# Patient Record
Sex: Female | Born: 1940
Health system: Southern US, Community
[De-identification: ages and names within clinical notes are randomized; demographics above are authoritative.]

## PROBLEM LIST (undated history)

## (undated) DIAGNOSIS — K219 Gastro-esophageal reflux disease without esophagitis: Secondary | ICD-10-CM

## (undated) DIAGNOSIS — M1990 Unspecified osteoarthritis, unspecified site: Secondary | ICD-10-CM

## (undated) DIAGNOSIS — IMO0001 Reserved for inherently not codable concepts without codable children: Secondary | ICD-10-CM

## (undated) DIAGNOSIS — J449 Chronic obstructive pulmonary disease, unspecified: Secondary | ICD-10-CM

## (undated) HISTORY — PX: ABDOMINAL HYSTERECTOMY: SHX81

## (undated) HISTORY — PX: TOTAL KNEE ARTHROPLASTY: SHX125

---

## 1998-11-28 ENCOUNTER — Other Ambulatory Visit: Admission: RE | Admit: 1998-11-28 | Discharge: 1998-11-28 | Payer: Self-pay | Admitting: Gynecology

## 1998-12-04 ENCOUNTER — Ambulatory Visit (HOSPITAL_BASED_OUTPATIENT_CLINIC_OR_DEPARTMENT_OTHER): Admission: RE | Admit: 1998-12-04 | Discharge: 1998-12-04 | Payer: Self-pay | Admitting: Surgery

## 1999-07-19 ENCOUNTER — Ambulatory Visit (HOSPITAL_COMMUNITY): Admission: RE | Admit: 1999-07-19 | Discharge: 1999-07-19 | Payer: Self-pay | Admitting: *Deleted

## 1999-08-20 ENCOUNTER — Ambulatory Visit (HOSPITAL_BASED_OUTPATIENT_CLINIC_OR_DEPARTMENT_OTHER): Admission: RE | Admit: 1999-08-20 | Discharge: 1999-08-20 | Payer: Self-pay | Admitting: Orthopedic Surgery

## 1999-12-04 ENCOUNTER — Other Ambulatory Visit: Admission: RE | Admit: 1999-12-04 | Discharge: 1999-12-04 | Payer: Self-pay | Admitting: Obstetrics and Gynecology

## 2000-02-07 ENCOUNTER — Ambulatory Visit (HOSPITAL_BASED_OUTPATIENT_CLINIC_OR_DEPARTMENT_OTHER): Admission: RE | Admit: 2000-02-07 | Discharge: 2000-02-07 | Payer: Self-pay | Admitting: Orthopedic Surgery

## 2001-01-22 ENCOUNTER — Ambulatory Visit (HOSPITAL_COMMUNITY): Admission: RE | Admit: 2001-01-22 | Discharge: 2001-01-22 | Payer: Self-pay | Admitting: *Deleted

## 2001-04-10 ENCOUNTER — Encounter: Payer: Self-pay | Admitting: Orthopedic Surgery

## 2001-04-20 ENCOUNTER — Inpatient Hospital Stay (HOSPITAL_COMMUNITY): Admission: RE | Admit: 2001-04-20 | Discharge: 2001-04-24 | Payer: Self-pay | Admitting: Orthopedic Surgery

## 2002-01-19 ENCOUNTER — Other Ambulatory Visit: Admission: RE | Admit: 2002-01-19 | Discharge: 2002-01-19 | Payer: Self-pay | Admitting: Obstetrics and Gynecology

## 2004-05-15 ENCOUNTER — Other Ambulatory Visit: Admission: RE | Admit: 2004-05-15 | Discharge: 2004-05-15 | Payer: Self-pay | Admitting: Obstetrics and Gynecology

## 2004-12-18 ENCOUNTER — Ambulatory Visit: Payer: Self-pay | Admitting: Pulmonary Disease

## 2005-01-23 ENCOUNTER — Ambulatory Visit: Payer: Self-pay | Admitting: Pulmonary Disease

## 2010-01-19 ENCOUNTER — Telehealth (INDEPENDENT_AMBULATORY_CARE_PROVIDER_SITE_OTHER): Payer: Self-pay | Admitting: *Deleted

## 2010-10-18 NOTE — Progress Notes (Signed)
Summary: [facility] records request  Records request received from the fax machine. Forwarded to [employer] for processing. Rene Kocher Flowers  Jan 19, 2010 5:01 PM

## 2011-06-05 ENCOUNTER — Other Ambulatory Visit: Payer: Self-pay | Admitting: Orthopedic Surgery

## 2011-06-05 ENCOUNTER — Encounter (HOSPITAL_COMMUNITY): Payer: Medicare Other

## 2011-06-05 LAB — COMPREHENSIVE METABOLIC PANEL
ALT: 21 U/L (ref 0–35)
AST: 24 U/L (ref 0–37)
Albumin: 3.8 g/dL (ref 3.5–5.2)
Alkaline Phosphatase: 75 U/L (ref 39–117)
BUN: 20 mg/dL (ref 6–23)
CO2: 29 mEq/L (ref 19–32)
Calcium: 9.3 mg/dL (ref 8.4–10.5)
Chloride: 102 mEq/L (ref 96–112)
Creatinine, Ser: 0.73 mg/dL (ref 0.50–1.10)
GFR calc Af Amer: >60 mL/min (ref >60)
GFR calc non Af Amer: >60 mL/min (ref >60)
Glucose, Bld: 86 mg/dL (ref 70–99)
Potassium: 4 mEq/L (ref 3.5–5.1)
Sodium: 139 mEq/L (ref 135–145)
Total Bilirubin: 0.3 mg/dL (ref 0.3–1.2)
Total Protein: 7.7 g/dL (ref 6.0–8.3)

## 2011-06-05 LAB — SURGICAL PCR SCREEN
MRSA, PCR: NEGATIVE
Staphylococcus aureus: NEGATIVE

## 2011-06-05 LAB — CBC
HCT: 42.3 % (ref 36.0–46.0)
Hemoglobin: 14.2 g/dL (ref 12.0–15.0)
MCH: 31.6 pg (ref 26.0–34.0)
MCHC: 33.6 g/dL (ref 30.0–36.0)
MCV: 94.2 fL (ref 78.0–100.0)
Platelets: 186 10*3/uL (ref 150–400)
RBC: 4.49 MIL/uL (ref 3.87–5.11)
RDW: 13.4 % (ref 11.5–15.5)
WBC: 5.2 10*3/uL (ref 4.0–10.5)

## 2011-06-05 LAB — URINALYSIS, ROUTINE W REFLEX MICROSCOPIC
Bilirubin Urine: NEGATIVE
Glucose, UA: NEGATIVE mg/dL
Hgb urine dipstick: NEGATIVE
Ketones, ur: NEGATIVE mg/dL
Leukocytes, UA: NEGATIVE
Nitrite: NEGATIVE
Protein, ur: NEGATIVE mg/dL
Specific Gravity, Urine: 1.023 (ref 1.005–1.030)
Urobilinogen, UA: 1 mg/dL (ref 0.0–1.0)
pH: 5.5 (ref 5.0–8.0)

## 2011-06-05 LAB — PROTIME-INR
INR: 1.05 (ref 0.00–1.49)
Prothrombin Time: 13.9 seconds (ref 11.6–15.2)

## 2011-06-05 LAB — APTT: aPTT: 25 seconds (ref 24–37)

## 2011-06-05 LAB — ABO/RH: ABO/RH(D): A NEG

## 2011-06-10 ENCOUNTER — Inpatient Hospital Stay (HOSPITAL_COMMUNITY)
Admission: RE | Admit: 2011-06-10 | Discharge: 2011-06-13 | DRG: 470 | Disposition: A | Discharge status: Home Health | Payer: Medicare Other | Source: Ambulatory Visit | Attending: Orthopedic Surgery | Admitting: Orthopedic Surgery

## 2011-06-10 DIAGNOSIS — H547 Unspecified visual loss: Secondary | ICD-10-CM | POA: Diagnosis present

## 2011-06-10 DIAGNOSIS — J449 Chronic obstructive pulmonary disease, unspecified: Secondary | ICD-10-CM | POA: Diagnosis present

## 2011-06-10 DIAGNOSIS — Z01812 Encounter for preprocedural laboratory examination: Secondary | ICD-10-CM

## 2011-06-10 DIAGNOSIS — J4489 Other specified chronic obstructive pulmonary disease: Secondary | ICD-10-CM | POA: Diagnosis present

## 2011-06-10 DIAGNOSIS — M1710 Unilateral primary osteoarthritis, unspecified knee: Principal | ICD-10-CM | POA: Diagnosis present

## 2011-06-10 DIAGNOSIS — I8390 Asymptomatic varicose veins of unspecified lower extremity: Secondary | ICD-10-CM | POA: Diagnosis present

## 2011-06-10 DIAGNOSIS — Z96659 Presence of unspecified artificial knee joint: Secondary | ICD-10-CM

## 2011-06-10 DIAGNOSIS — M21169 Varus deformity, not elsewhere classified, unspecified knee: Secondary | ICD-10-CM | POA: Diagnosis present

## 2011-06-10 DIAGNOSIS — Z79899 Other long term (current) drug therapy: Secondary | ICD-10-CM

## 2011-06-10 DIAGNOSIS — M899 Disorder of bone, unspecified: Secondary | ICD-10-CM | POA: Diagnosis present

## 2011-06-10 LAB — TYPE AND SCREEN
ABO/RH(D): A NEG
Antibody Screen: NEGATIVE

## 2011-06-11 LAB — BASIC METABOLIC PANEL
CO2: 27 mEq/L (ref 19–32)
Calcium: 8.6 mg/dL (ref 8.4–10.5)
Chloride: 101 mEq/L (ref 96–112)
Sodium: 135 mEq/L (ref 135–145)

## 2011-06-11 LAB — CBC
Platelets: 168 10*3/uL (ref 150–400)
RBC: 3.57 MIL/uL — ABNORMAL LOW (ref 3.87–5.11)
WBC: 7.1 10*3/uL (ref 4.0–10.5)

## 2011-06-12 LAB — CBC
HCT: 30.6 % — ABNORMAL LOW (ref 36.0–46.0)
MCV: 91.9 fL (ref 78.0–100.0)
Platelets: 167 10*3/uL (ref 150–400)
RBC: 3.33 MIL/uL — ABNORMAL LOW (ref 3.87–5.11)
WBC: 9.6 10*3/uL (ref 4.0–10.5)

## 2011-06-12 LAB — BASIC METABOLIC PANEL
CO2: 28 mEq/L (ref 19–32)
Chloride: 95 mEq/L — ABNORMAL LOW (ref 96–112)
Creatinine, Ser: 0.63 mg/dL (ref 0.50–1.10)

## 2011-06-12 NOTE — Op Note (Signed)
[name], [name] NO.:  [id number]  MEDICAL RECORD NO.:  [id number]  LOCATION:  1608                         FACILITY:  [facility]  PHYSICIAN:  Ollen Gross, M.D.    DATE OF BIRTH:  July 23, 1941  DATE OF PROCEDURE:  06/10/2011 DATE OF DISCHARGE:                              OPERATIVE REPORT   PREOPERATIVE DIAGNOSIS:  Osteoarthritis, right knee.  POSTOPERATIVE DIAGNOSIS:  Osteoarthritis, right knee.  PROCEDURE:  Right total knee arthroplasty.  SURGEON:  Ollen Gross, M.D.  ASSISTANT:  Alexzandrew L. Perkins, P.A.C.  ANESTHESIA:  Spinal.  ESTIMATED BLOOD LOSS:  Minimal.  DRAIN:  Hemovac x1.  TOURNIQUET TIME:  39 minutes at 300 mmHg.  COMPLICATIONS:  None.  CONDITION:  Stable to recovery.  BRIEF CLINICAL NOTE:  [name] is a 70 year old female with advanced end-stage arthritis of the right knee with progressively worsening pain and dysfunction.  She has had bone-on-bone arthritis in the medial compartment of the knee.  It is affecting her ability to do activities of daily living.  She has had injections without benefit.  She has had a successful left total knee arthroplasty who presents now for a right total knee arthroplasty.  PROCEDURE IN DETAIL:  After successful administration of spinal anesthetic, a tourniquet was placed high on her right thigh and a right lower extremity was prepped and draped in the usual sterile fashion. Extremities wrapped in Esmarch, knee flexed, tourniquet inflated 300 mmHg.  Midline incision was made with a 10 blade through subcutaneous tissue to the level of the extensor mechanism.  A fresh blade was used to make a medial parapatellar arthrotomy.  Soft tissue of the proximal medial tibia subperiosteally elevated to the joint line with a knife and into the semimembranosus bursa with a Cobb elevator.  Soft tissue laterally was elevated with attention being paid to avoid any patellar tendon on tibial tubercle.  The  patella was everted, knee flexed 90 degrees, an ACL and PCL removed.  She has got severe bone-on-bone change throughout the medial compartment with significant chondromalacia and exposed bone lateral as well as exposed bone patellofemoral.  The drill was used to create a starting hole in the distal femur and the canal was thoroughly irrigated.  The 5 degree right valgus alignment guide was placed and the distal femoral cutting block was pinned to remove 10 mm off the distal femur.  Resection was made with an oscillating saw.  The tibia subluxed forward and the menisci were removed.  Extramedullary tibial alignment guide was placed referencing proximally at the medial aspect of the tibial tubercle distally along the second metatarsal axis and tibial crest.  Block was pinned to remove 2 mm off the more deficient medial side.  Tibial resection was made with an oscillating saw.  Size 3 was the most appropriate tibial component and the proximal tibia was prepared with modular drill and keel punch for the size 3.  Femoral sizing guide was placed, size 3 was most appropriate on the femur.  Rotations marked of the epicondylar axis and confirmed by creating rectangular flexion gap at 90 degrees.  The block was pinned in that rotation and the anterior, posterior, and  chamfer cuts were made. Intercondylar block was placed and that cut was made.  Trial size 3 posterior stabilized femur was placed.  A 12.5 mm posterior stabilized rotating platform insert trial was placed.  There was a tiny bit of play, with that it went to 15, which allowed for full extension with excellent varus-valgus and anterior-posterior balance throughout full range of motion.  The patella was everted and the thickness measured to be 22 mm.  Freehand resection was taken to 12 mm, 8 template was placed, lug holes were drilled, trial patella was placed, and it tracks normally.  Osteophytes removed off the posterior femur with the  trial in place.  All trials were removed and the cut bone surfaces were prepared with pulsatile lavage.  Cement was mixed and once ready for implantation, a size 3 mobile bearing tibial tray, size 3 posterior stabilized femur and 38 patella are cemented into place and the patella was held with a clamp.  Trial 15 mm inserts placed, knee held in full extension, all extruded cement removed.  When cement fully hardened, then the permanent 15 mm posterior stabilized rotating platform insert was placed in the tibial tray.  The wound was copiously irrigated with saline solution and the arthrotomy closed over Hemovac drain with interrupted #1 PDS.  Flexion against gravity was 135 degrees and patella tracks normally.  Subcu was closed with interrupted 2-0 Vicryl and subcuticular running 4-0 Monocryl.  The catheter for a Marcaine pain pump was placed and the pump was initiated.  Incisions were cleaned and dried and Steri-Strips and bulky sterile dressing applied.  She was then placed into a knee immobilizer, awakened, transferred to recovery in stable condition.  Please note that it was a medical necessity for a surgical assistant on this case in order to perform it in a safe and expeditious manner. Surgical assistant help with retraction of the ligaments and lateral neurovascular structures and also positioning the leg such that the prosthetic components can be placed in anatomic position.     Ollen Gross, M.D.     FA/MEDQ  D:  06/10/2011  T:  06/11/2011  Job:  [id number]  Electronically Signed by Ollen Gross M.D. on 06/12/2011 10:34:43 AM

## 2011-06-13 LAB — BASIC METABOLIC PANEL
BUN: 8 mg/dL (ref 6–23)
Calcium: 8.8 mg/dL (ref 8.4–10.5)
Chloride: 94 mEq/L — ABNORMAL LOW (ref 96–112)
Creatinine, Ser: 0.56 mg/dL (ref 0.50–1.10)
GFR calc Af Amer: >60 mL/min (ref >60)
GFR calc non Af Amer: >60 mL/min (ref >60)

## 2011-06-13 LAB — CBC
MCHC: 34.9 g/dL (ref 30.0–36.0)
MCV: 91.2 fL (ref 78.0–100.0)
Platelets: 185 10*3/uL (ref 150–400)
RDW: 13 % (ref 11.5–15.5)
WBC: 10.8 10*3/uL — ABNORMAL HIGH (ref 4.0–10.5)

## 2011-06-26 NOTE — H&P (Signed)
[name], [name] NO.:  [id number]  MEDICAL RECORD NO.:  [id number]  LOCATION:  1608                         FACILITY:  [facility]  PHYSICIAN:  Ollen Gross, M.D.    DATE OF BIRTH:  [birth year]  DATE OF ADMISSION:  06/10/2011 DATE OF DISCHARGE:                             HISTORY & PHYSICAL   CHIEF COMPLAINT:  Right knee pain.  HISTORY OF PRESENT ILLNESS:  The patient is a 70 year old female, well known to Dr. Ollen Gross, having previously been seen for her right knee.  She has known arthritis, which has progressively gotten worse with time.  X-rays in the office, unfortunately, have shown that she has progressed to the point where she is bone-on-bone in the medial compartment and also very close to being bone-on-bone in the patellofemoral compartment.  She has already started to go into a little bit of varus deformity.  She has advanced arthritis.  She has been treated in the past with injections and unfortunately continues to have pain despite conservative measures including injections.  It is felt she would benefit from undergoing surgical intervention.  Risks and benefits have been discussed.  She elected to proceed with surgery.  She has been seen preop by Dr. Earl Gala and felt to be stable for upcoming procedure.  ALLERGIES:  No known drug allergies.  INTOLERANCES:  OXYCODONE causes heartburn and "itchy feeling."  CURRENT MEDICATIONS:  Evista, vitamin D, calcium, multivitamin, Relafen, and Chlorphen.  PAST MEDICAL HISTORY: 1. Impaired vision. 2. Slight COPD. 3. Slight asthma. 4. Varicose veins. 5. Osteopenia.  PAST SURGICAL HISTORY:  Left total knee replacement in 2002.  FAMILY HISTORY:  Mother with Alzheimer's.  Sister with breast and uterine cancer.  SOCIAL HISTORY:  Married.  [employer].  Past smoker, quit in 1985.  1-2 glasses of wine daily.  She has 3 children.  She has 3 steps entering her home.  She does have a  living will healthcare power of attorney.  REVIEW OF SYSTEMS:  GENERAL:  No fever, chills, or night sweats.  NEURO: No seizure, syncope, or paralysis.  RESPIRATORY:  No shortness of breath, productive cough, or hemoptysis.  CARDIOVASCULAR:  No chest pain, angina, orthopnea.  GI:  No nausea, vomiting, diarrhea, or constipation.  GU:  No dysuria, hematuria, or discharge. MUSCULOSKELETAL:  Knee pain.  PHYSICAL EXAMINATION:  VITAL SIGNS:  Pulse 72, respirations 14, blood pressure 116/62. GENERAL:  A 70 year old white female, well nourished, well developed, petite frame.  She is alert, oriented, and cooperative.  Excellent historian.  She is accompanied by her friend. HEENT:  Normocephalic, atraumatic.  Pupils are round and reactive.  EOMs intact. NECK:  Supple. CHEST:  Clear. HEART:  Regular rate and rhythm without murmur.  S1, S2 noted. ABDOMEN:  Soft, nontender.  Bowel sounds present. RECTAL/BREASTS/GENITALIA:  Not done, not pertinent to present illness. EXTREMITIES:  Right knee, moderate crepitus, tender more medial joint line, range of motion 5-125 with slight varus deformity.  IMPRESSION:  Osteoarthritis, right knee.  PLAN:  The patient admitted to [facility] to undergo a right total knee replacement arthroplasty.  Surgery will be performed by Dr. Ollen Gross.  Her plan is to go home following the hospital stay.     Alexzandrew L. Julien Girt, P.A.C.   ______________________________ Ollen Gross, M.D.    ALP/MEDQ  D:  06/12/2011  T:  06/13/2011  Job:  [id number]  cc:   [name], M.D. Fax: [phone]  Electronically Signed by Patrica Duel P.A.C. on 06/13/2011 11:20:26 AM Electronically Signed by Ollen Gross M.D. on 06/26/2011 11:18:24 AM

## 2011-06-28 NOTE — Discharge Summary (Signed)
NAMESHAKILA, MAK NO.:  [id number]  MEDICAL RECORD NO.:  [id number]  LOCATION:  1608                         FACILITY:  [facility]  PHYSICIAN:  Ollen Gross, M.D.    DATE OF BIRTH:  03-30-1941  DATE OF ADMISSION:  06/10/2011 DATE OF DISCHARGE:  06/13/2011                              DISCHARGE SUMMARY   ADMITTING DIAGNOSES: 1. Osteoarthritis of the right knee. 2. Impaired vision. 3. Slight chronic obstructive pulmonary disease. 4. Slight asthma. 5. Varicose veins. 6. Osteopenia.  DISCHARGE DIAGNOSES: 1. Osteoarthritis of the right knee, status post right total knee     replacement arthroplasty. 2. Postoperative hyponatremia, improving. 3. Impaired vision. 4. Slight chronic obstructive pulmonary disease. 5. Slight asthma. 6. Varicose veins 7. Osteopenia.  PROCEDURE:  June 10, 2011, right total knee.  SURGEON:  Ollen Gross, MD  ASSISTANT:  Alexzandrew L. Perkins, P.A.C. ANESTHESIA:  Spinal anesthesia.  TOURNIQUET TIME:  39 minutes.  CONSULTS:  None.  BRIEF HISTORY:  Ms. [name] is a 70 year old female with advanced end- stage arthritis of the right knee, progressive worsening pain and dysfunction.  She has bone-on-bone arthritis in the medial compartment affecting her ability to do activities of daily living.  She has had injections without benefit, now presents for total knee arthroplasty.  LABORATORY DATA:  The CBC is not scanned in the chart, not available, but the postop hemoglobin was 11.3 drifted down to 10.8, last known hemoglobin and hematocrit of 10.1 and 28.9.  Chem panel not scanned in the chart.  Sodium was 135 dropped down to 128, back up to 129; chloride dropped from 101 to 94 with the sodium drop; glucose went up from 114 to 142, back down to 129.  HOSPITAL COURSE:  The patient admitted to the [facility], taken to the OR, and underwent above-stated procedure without complication.  The patient tolerated the  procedure well, later transferred to recovery room on orthopedic floor, started on p.o. and IV analgesic pain control following surgery, given 24 hours postop IV antibiotics, had decent output through Foley.  Hemovac drain placed during the surgery was pulled.  She was started on PCA initially and that was discontinued on day #1.  By day #2, pain was under little bit better control.  Her sodium was down though, so we rechecked that after stopping the fluids.  Dressing changed, incision looked good.  Continued to get up with therapy, walking short distances and progressing with her mobility.  Up to the point day #3, she was doing well.  She was meeting her goals, tolerating her meds.  Her sodium had stabilized and starting to come back up and she was discharged home at that time.  DISCHARGE PLAN: 1. The patient was discharged home on June 13, 2011. 2. Discharge diagnoses, please see above. 3. Discharge meds; OxyIR, Robaxin, Xarelto.  Continue chlorpheniramine     over the counter.  DIET:  Heart-healthy diet as tolerated.  ACTIVITY:  Weightbearing as tolerated.  Total knee protocol.  FOLLOWUP:  2 weeks.  DISPOSITION:  Home.  CONDITION ON DISCHARGE:  Improved.     Alexzandrew L. Julien Girt, P.A.C.   ______________________________ Ollen Gross, M.D.  ALP/MEDQ  D:  06/21/2011  T:  06/21/2011  Job:  [id number]  cc:   [name], M.D. Fax: [phone]  Electronically Signed by Patrica Duel P.A.C. on 06/28/2011 06:30:30 AM Electronically Signed by Ollen Gross M.D. on 06/28/2011 95:62:13 PM

## 2011-09-23 DIAGNOSIS — Z1231 Encounter for screening mammogram for malignant neoplasm of breast: Secondary | ICD-10-CM | POA: Diagnosis not present

## 2011-09-23 DIAGNOSIS — Z803 Family history of malignant neoplasm of breast: Secondary | ICD-10-CM | POA: Diagnosis not present

## 2011-10-03 DIAGNOSIS — H524 Presbyopia: Secondary | ICD-10-CM | POA: Diagnosis not present

## 2011-10-03 DIAGNOSIS — H11159 Pinguecula, unspecified eye: Secondary | ICD-10-CM | POA: Diagnosis not present

## 2011-10-18 DIAGNOSIS — L820 Inflamed seborrheic keratosis: Secondary | ICD-10-CM | POA: Diagnosis not present

## 2011-11-18 DIAGNOSIS — L570 Actinic keratosis: Secondary | ICD-10-CM | POA: Diagnosis not present

## 2012-01-22 DIAGNOSIS — Z1331 Encounter for screening for depression: Secondary | ICD-10-CM | POA: Diagnosis not present

## 2012-01-22 DIAGNOSIS — J449 Chronic obstructive pulmonary disease, unspecified: Secondary | ICD-10-CM | POA: Diagnosis not present

## 2012-01-22 DIAGNOSIS — M949 Disorder of cartilage, unspecified: Secondary | ICD-10-CM | POA: Diagnosis not present

## 2012-01-22 DIAGNOSIS — Z131 Encounter for screening for diabetes mellitus: Secondary | ICD-10-CM | POA: Diagnosis not present

## 2012-01-22 DIAGNOSIS — Z0000 Encounter for general adult medical examination without abnormal findings: Secondary | ICD-10-CM | POA: Diagnosis not present

## 2012-01-22 DIAGNOSIS — M899 Disorder of bone, unspecified: Secondary | ICD-10-CM | POA: Diagnosis not present

## 2012-02-24 DIAGNOSIS — L570 Actinic keratosis: Secondary | ICD-10-CM | POA: Diagnosis not present

## 2012-02-24 DIAGNOSIS — L821 Other seborrheic keratosis: Secondary | ICD-10-CM | POA: Diagnosis not present

## 2012-06-16 DIAGNOSIS — M1710 Unilateral primary osteoarthritis, unspecified knee: Secondary | ICD-10-CM | POA: Diagnosis not present

## 2012-06-27 DIAGNOSIS — Z23 Encounter for immunization: Secondary | ICD-10-CM | POA: Diagnosis not present

## 2012-08-25 DIAGNOSIS — D485 Neoplasm of uncertain behavior of skin: Secondary | ICD-10-CM | POA: Diagnosis not present

## 2012-08-25 DIAGNOSIS — L821 Other seborrheic keratosis: Secondary | ICD-10-CM | POA: Diagnosis not present

## 2012-08-25 DIAGNOSIS — L570 Actinic keratosis: Secondary | ICD-10-CM | POA: Diagnosis not present

## 2012-09-10 DIAGNOSIS — H6120 Impacted cerumen, unspecified ear: Secondary | ICD-10-CM | POA: Diagnosis not present

## 2012-09-10 DIAGNOSIS — H9209 Otalgia, unspecified ear: Secondary | ICD-10-CM | POA: Diagnosis not present

## 2012-09-29 DIAGNOSIS — L570 Actinic keratosis: Secondary | ICD-10-CM | POA: Diagnosis not present

## 2012-09-29 DIAGNOSIS — L568 Other specified acute skin changes due to ultraviolet radiation: Secondary | ICD-10-CM | POA: Diagnosis not present

## 2012-09-29 DIAGNOSIS — D485 Neoplasm of uncertain behavior of skin: Secondary | ICD-10-CM | POA: Diagnosis not present

## 2012-09-30 DIAGNOSIS — Z1231 Encounter for screening mammogram for malignant neoplasm of breast: Secondary | ICD-10-CM | POA: Diagnosis not present

## 2013-01-12 DIAGNOSIS — L570 Actinic keratosis: Secondary | ICD-10-CM | POA: Diagnosis not present

## 2013-01-12 DIAGNOSIS — L821 Other seborrheic keratosis: Secondary | ICD-10-CM | POA: Diagnosis not present

## 2013-01-28 DIAGNOSIS — Z131 Encounter for screening for diabetes mellitus: Secondary | ICD-10-CM | POA: Diagnosis not present

## 2013-01-28 DIAGNOSIS — Z1331 Encounter for screening for depression: Secondary | ICD-10-CM | POA: Diagnosis not present

## 2013-01-28 DIAGNOSIS — K219 Gastro-esophageal reflux disease without esophagitis: Secondary | ICD-10-CM | POA: Diagnosis not present

## 2013-01-28 DIAGNOSIS — Z0000 Encounter for general adult medical examination without abnormal findings: Secondary | ICD-10-CM | POA: Diagnosis not present

## 2013-02-26 DIAGNOSIS — Z23 Encounter for immunization: Secondary | ICD-10-CM | POA: Diagnosis not present

## 2013-05-19 DIAGNOSIS — Z23 Encounter for immunization: Secondary | ICD-10-CM | POA: Diagnosis not present

## 2013-08-30 DIAGNOSIS — L819 Disorder of pigmentation, unspecified: Secondary | ICD-10-CM | POA: Diagnosis not present

## 2013-08-30 DIAGNOSIS — L570 Actinic keratosis: Secondary | ICD-10-CM | POA: Diagnosis not present

## 2013-08-30 DIAGNOSIS — L820 Inflamed seborrheic keratosis: Secondary | ICD-10-CM | POA: Diagnosis not present

## 2013-08-30 DIAGNOSIS — L821 Other seborrheic keratosis: Secondary | ICD-10-CM | POA: Diagnosis not present

## 2013-10-05 DIAGNOSIS — H2510 Age-related nuclear cataract, unspecified eye: Secondary | ICD-10-CM | POA: Diagnosis not present

## 2013-11-15 DIAGNOSIS — Z1231 Encounter for screening mammogram for malignant neoplasm of breast: Secondary | ICD-10-CM | POA: Diagnosis not present

## 2013-11-15 DIAGNOSIS — Z803 Family history of malignant neoplasm of breast: Secondary | ICD-10-CM | POA: Diagnosis not present

## 2014-02-15 ENCOUNTER — Other Ambulatory Visit: Payer: Self-pay | Admitting: Internal Medicine

## 2014-02-15 DIAGNOSIS — Z0000 Encounter for general adult medical examination without abnormal findings: Secondary | ICD-10-CM | POA: Diagnosis not present

## 2014-02-15 DIAGNOSIS — R1310 Dysphagia, unspecified: Secondary | ICD-10-CM | POA: Diagnosis not present

## 2014-02-15 DIAGNOSIS — K5900 Constipation, unspecified: Secondary | ICD-10-CM | POA: Diagnosis not present

## 2014-02-15 DIAGNOSIS — M899 Disorder of bone, unspecified: Secondary | ICD-10-CM | POA: Diagnosis not present

## 2014-02-15 DIAGNOSIS — Z131 Encounter for screening for diabetes mellitus: Secondary | ICD-10-CM | POA: Diagnosis not present

## 2014-02-15 DIAGNOSIS — Z23 Encounter for immunization: Secondary | ICD-10-CM | POA: Diagnosis not present

## 2014-02-15 DIAGNOSIS — M949 Disorder of cartilage, unspecified: Secondary | ICD-10-CM | POA: Diagnosis not present

## 2014-02-21 ENCOUNTER — Ambulatory Visit
Admission: RE | Admit: 2014-02-21 | Discharge: 2014-02-21 | Disposition: A | Discharge status: Home / Self Care | Payer: Medicare Other | Source: Ambulatory Visit | Attending: Internal Medicine | Admitting: Internal Medicine

## 2014-02-21 DIAGNOSIS — K2289 Other specified disease of esophagus: Secondary | ICD-10-CM | POA: Diagnosis not present

## 2014-02-21 DIAGNOSIS — K228 Other specified diseases of esophagus: Secondary | ICD-10-CM | POA: Diagnosis not present

## 2014-02-21 DIAGNOSIS — R1310 Dysphagia, unspecified: Secondary | ICD-10-CM

## 2014-02-28 DIAGNOSIS — N952 Postmenopausal atrophic vaginitis: Secondary | ICD-10-CM | POA: Diagnosis not present

## 2014-02-28 DIAGNOSIS — N816 Rectocele: Secondary | ICD-10-CM | POA: Diagnosis not present

## 2014-02-28 DIAGNOSIS — N8111 Cystocele, midline: Secondary | ICD-10-CM | POA: Diagnosis not present

## 2014-03-14 ENCOUNTER — Other Ambulatory Visit (HOSPITAL_COMMUNITY): Payer: Self-pay | Admitting: Orthopedic Surgery

## 2014-03-14 DIAGNOSIS — M25562 Pain in left knee: Secondary | ICD-10-CM

## 2014-03-14 DIAGNOSIS — Z471 Aftercare following joint replacement surgery: Secondary | ICD-10-CM | POA: Diagnosis not present

## 2014-03-14 DIAGNOSIS — Z96659 Presence of unspecified artificial knee joint: Secondary | ICD-10-CM | POA: Diagnosis not present

## 2014-03-15 DIAGNOSIS — M949 Disorder of cartilage, unspecified: Secondary | ICD-10-CM | POA: Diagnosis not present

## 2014-03-15 DIAGNOSIS — M899 Disorder of bone, unspecified: Secondary | ICD-10-CM | POA: Diagnosis not present

## 2014-03-28 ENCOUNTER — Ambulatory Visit (HOSPITAL_COMMUNITY): Payer: Medicare Other

## 2014-03-28 ENCOUNTER — Encounter (HOSPITAL_COMMUNITY): Payer: Medicare Other

## 2014-04-01 ENCOUNTER — Ambulatory Visit (HOSPITAL_COMMUNITY)
Admission: RE | Admit: 2014-04-01 | Discharge: 2014-04-01 | Disposition: A | Discharge status: Home / Self Care | Payer: Medicare Other | Source: Ambulatory Visit | Attending: Orthopedic Surgery | Admitting: Orthopedic Surgery

## 2014-04-01 ENCOUNTER — Encounter (HOSPITAL_COMMUNITY): Payer: Self-pay

## 2014-04-01 ENCOUNTER — Encounter (HOSPITAL_COMMUNITY)
Admission: RE | Admit: 2014-04-01 | Discharge: 2014-04-01 | Disposition: A | Discharge status: Home / Self Care | Payer: Medicare Other | Source: Ambulatory Visit | Attending: Orthopedic Surgery | Admitting: Orthopedic Surgery

## 2014-04-01 DIAGNOSIS — Z96659 Presence of unspecified artificial knee joint: Secondary | ICD-10-CM | POA: Diagnosis not present

## 2014-04-01 DIAGNOSIS — M25562 Pain in left knee: Secondary | ICD-10-CM

## 2014-04-01 DIAGNOSIS — M25569 Pain in unspecified knee: Secondary | ICD-10-CM | POA: Diagnosis not present

## 2014-04-01 MED ORDER — TECHNETIUM TC 99M MEDRONATE IV KIT
24.9000 | PACK | Freq: Once | INTRAVENOUS | Status: AC | PRN
  Administered 2014-04-01: 24.9 via INTRAVENOUS

## 2014-04-11 DIAGNOSIS — L570 Actinic keratosis: Secondary | ICD-10-CM | POA: Diagnosis not present

## 2014-04-11 DIAGNOSIS — D692 Other nonthrombocytopenic purpura: Secondary | ICD-10-CM | POA: Diagnosis not present

## 2014-04-11 DIAGNOSIS — L578 Other skin changes due to chronic exposure to nonionizing radiation: Secondary | ICD-10-CM | POA: Diagnosis not present

## 2014-04-11 DIAGNOSIS — L819 Disorder of pigmentation, unspecified: Secondary | ICD-10-CM | POA: Diagnosis not present

## 2014-04-11 DIAGNOSIS — L821 Other seborrheic keratosis: Secondary | ICD-10-CM | POA: Diagnosis not present

## 2014-05-19 ENCOUNTER — Ambulatory Visit (HOSPITAL_COMMUNITY)
Admission: RE | Admit: 2014-05-19 | Payer: Medicare Other | Source: Ambulatory Visit | Admitting: Obstetrics & Gynecology

## 2014-05-19 ENCOUNTER — Encounter (HOSPITAL_COMMUNITY): Admission: RE | Payer: Self-pay | Source: Ambulatory Visit

## 2014-05-19 SURGERY — ANTERIOR (CYSTOCELE) AND POSTERIOR REPAIR (RECTOCELE)
Anesthesia: Choice

## 2014-05-24 DIAGNOSIS — H1045 Other chronic allergic conjunctivitis: Secondary | ICD-10-CM | POA: Diagnosis not present

## 2014-07-06 DIAGNOSIS — Z23 Encounter for immunization: Secondary | ICD-10-CM | POA: Diagnosis not present

## 2014-07-25 DIAGNOSIS — Z23 Encounter for immunization: Secondary | ICD-10-CM | POA: Diagnosis not present

## 2014-10-06 DIAGNOSIS — L570 Actinic keratosis: Secondary | ICD-10-CM | POA: Diagnosis not present

## 2014-10-06 DIAGNOSIS — L720 Epidermal cyst: Secondary | ICD-10-CM | POA: Diagnosis not present

## 2014-10-06 DIAGNOSIS — L820 Inflamed seborrheic keratosis: Secondary | ICD-10-CM | POA: Diagnosis not present

## 2014-10-06 DIAGNOSIS — L821 Other seborrheic keratosis: Secondary | ICD-10-CM | POA: Diagnosis not present

## 2014-11-24 DIAGNOSIS — Z1231 Encounter for screening mammogram for malignant neoplasm of breast: Secondary | ICD-10-CM | POA: Diagnosis not present

## 2014-11-24 DIAGNOSIS — Z803 Family history of malignant neoplasm of breast: Secondary | ICD-10-CM | POA: Diagnosis not present

## 2015-03-07 DIAGNOSIS — Z0000 Encounter for general adult medical examination without abnormal findings: Secondary | ICD-10-CM | POA: Diagnosis not present

## 2015-03-07 DIAGNOSIS — Z1389 Encounter for screening for other disorder: Secondary | ICD-10-CM | POA: Diagnosis not present

## 2015-03-07 DIAGNOSIS — Z1211 Encounter for screening for malignant neoplasm of colon: Secondary | ICD-10-CM | POA: Diagnosis not present

## 2015-03-07 DIAGNOSIS — E780 Pure hypercholesterolemia: Secondary | ICD-10-CM | POA: Diagnosis not present

## 2015-03-27 DIAGNOSIS — J209 Acute bronchitis, unspecified: Secondary | ICD-10-CM | POA: Diagnosis not present

## 2015-04-03 DIAGNOSIS — L821 Other seborrheic keratosis: Secondary | ICD-10-CM | POA: Diagnosis not present

## 2015-04-03 DIAGNOSIS — I788 Other diseases of capillaries: Secondary | ICD-10-CM | POA: Diagnosis not present

## 2015-06-23 DIAGNOSIS — Z23 Encounter for immunization: Secondary | ICD-10-CM | POA: Diagnosis not present

## 2015-07-03 DIAGNOSIS — J209 Acute bronchitis, unspecified: Secondary | ICD-10-CM | POA: Diagnosis not present

## 2015-07-13 ENCOUNTER — Other Ambulatory Visit: Payer: Self-pay | Admitting: Gastroenterology

## 2015-09-02 DIAGNOSIS — R05 Cough: Secondary | ICD-10-CM | POA: Diagnosis not present

## 2015-09-20 ENCOUNTER — Telehealth: Payer: Self-pay | Admitting: Internal Medicine

## 2015-09-20 DIAGNOSIS — R0600 Dyspnea, unspecified: Secondary | ICD-10-CM | POA: Diagnosis not present

## 2015-09-20 DIAGNOSIS — R05 Cough: Secondary | ICD-10-CM | POA: Diagnosis not present

## 2015-09-20 DIAGNOSIS — J309 Allergic rhinitis, unspecified: Secondary | ICD-10-CM | POA: Diagnosis not present

## 2015-09-20 NOTE — Telephone Encounter (Signed)
Spoke with pt, wanting to know if we had any openings today to move her appt up.  I advised that we did not.  Pt states she "needs a medication regime".  I advised that since we've never seen her in clinic so we would not be able to recommend any medications for her at this time.  I advised pt to contact her PCP.  Pt expressed understanding.  Nothing further needed at this time.

## 2015-09-21 DIAGNOSIS — H2513 Age-related nuclear cataract, bilateral: Secondary | ICD-10-CM | POA: Diagnosis not present

## 2015-09-26 ENCOUNTER — Ambulatory Visit (HOSPITAL_COMMUNITY): Admission: RE | Admit: 2015-09-26 | Payer: Medicare Other | Source: Ambulatory Visit | Admitting: Gastroenterology

## 2015-09-26 ENCOUNTER — Encounter (HOSPITAL_COMMUNITY): Admission: RE | Payer: Self-pay | Source: Ambulatory Visit

## 2015-09-26 SURGERY — COLONOSCOPY WITH PROPOFOL
Anesthesia: Monitor Anesthesia Care

## 2015-10-02 ENCOUNTER — Encounter: Payer: Self-pay | Admitting: Internal Medicine

## 2015-10-02 ENCOUNTER — Ambulatory Visit (INDEPENDENT_AMBULATORY_CARE_PROVIDER_SITE_OTHER): Payer: Medicare Other | Admitting: Internal Medicine

## 2015-10-02 VITALS — BP 112/70 | HR 76 | Ht 63.0 in | Wt 137.8 lb

## 2015-10-02 DIAGNOSIS — R05 Cough: Secondary | ICD-10-CM

## 2015-10-02 DIAGNOSIS — J449 Chronic obstructive pulmonary disease, unspecified: Secondary | ICD-10-CM | POA: Diagnosis not present

## 2015-10-02 DIAGNOSIS — R058 Other specified cough: Secondary | ICD-10-CM

## 2015-10-02 NOTE — Progress Notes (Signed)
   Subjective:    Patient ID: [name], female    DOB: [birth year],   MRN: [id number]  HPI  75 yowf quit smoking  Age 75 (1987)   with tendency for uri's x 2011 to "go in to the chest" and not need any chronic rx until  early Fall 2016 p [facility] July self referred to pulmonary clinic 10/02/2015    10/02/2015 1st [facility] office visit/ Luiza Carranco   Chief Complaint  Patient presents with  . Pulmonary Consult    Self referral.  Pt c/o frequent URI for the past year. She states that her breathing is currently at her normal baseline and she is not coughing much.   Not limited by breathing from desired activities  / only sob with heavy exertion which she doesn't do much of at all   No obvious other patterns in day to day or daytime variabilty or assoc excess/ purulent sputum or mucus plugs   or cp or chest tightness, subjective wheeze overt sinus or hb symptoms. No unusual exp hx or h/o childhood pna/ asthma or knowledge of premature birth.  Sleeping ok without nocturnal  or early am exacerbation  of respiratory  c/o's or need for noct saba. Also denies any obvious fluctuation of symptoms with weather or environmental changes or other aggravating or alleviating factors except as outlined above   Current Medications, Allergies, Complete Past Medical History, Past Surgical History, Family History, and Social History were reviewed in [employer] record.           Review of Systems  Constitutional: Negative for fever, chills and unexpected weight change.  HENT: Negative for congestion, dental problem, ear pain, nosebleeds, postnasal drip, rhinorrhea, sinus pressure, sneezing, sore throat, trouble swallowing and voice change.   Eyes: Negative for visual disturbance.  Respiratory: Negative for cough, choking and shortness of breath.   Cardiovascular: Negative for chest pain and leg swelling.  Gastrointestinal: Negative for vomiting, abdominal pain  and diarrhea.  Genitourinary: Negative for difficulty urinating.  Musculoskeletal: Negative for arthralgias.  Skin: Negative for rash.  Neurological: Negative for tremors, syncope and headaches.  Hematological: Does not bruise/bleed easily.       Objective:   Physical Exam  amb wf with nasal tone to voice  Wt Readings from Last 3 Encounters:  10/02/15 137 lb 12.8 oz (62.506 kg)    Vital signs reviewed  HEENT: nl dentition, turbinates, and oropharynx. Nl external ear canals without cough reflex   NECK :  without JVD/Nodes/TM/ nl carotid upstrokes bilaterally   LUNGS: no acc muscle use,  Nl contour chest which is clear to A and P bilaterally without cough on insp or exp maneuvers   CV:  RRR  no s3 or murmur or increase in P2, no edema   ABD:  soft and nontender with nl inspiratory excursion in the supine position. No bruits or organomegaly, bowel sounds nl  MS:  Nl gait/ ext warm without deformities, calf tenderness, cyanosis or clubbing No obvious joint restrictions   SKIN: warm and dry without lesions    NEURO:  alert, approp, nl sensorium with  no motor deficits        I personally reviewed images and agree with radiology impression as follows:  CXR:  09/02/15 Mild copd/ moderate T kyphosis         Assessment & Plan:

## 2015-10-02 NOTE — Patient Instructions (Addendum)
Plan =  Automatic = no maintenance is indicated unless you start needing your backup more than twice daily on  A regular basis   Plan B = Backup Only use your albuterol (proair)  as a rescue medication to be used if you can't catch your breath by resting or doing a relaxed purse lip breathing pattern.  - The less you use it, the better it will work when you need it. - Ok to use up to 2 puffs  every 4 hours if you must but call for immediate appointment if use goes up over your usual need - Don't leave home without it !!  (think of it like the spare tire for your car)   For coughing > mucinex dm up to 1200 mg every 12 hours as needed   Please see patient coordinator before you leave today  to schedule sinus CT and I will call you with results

## 2015-10-03 ENCOUNTER — Ambulatory Visit (INDEPENDENT_AMBULATORY_CARE_PROVIDER_SITE_OTHER)
Admission: RE | Admit: 2015-10-03 | Discharge: 2015-10-03 | Disposition: A | Discharge status: Home / Self Care | Payer: Medicare Other | Source: Ambulatory Visit | Attending: Internal Medicine | Admitting: Internal Medicine

## 2015-10-03 ENCOUNTER — Telehealth: Payer: Self-pay | Admitting: Internal Medicine

## 2015-10-03 ENCOUNTER — Encounter: Payer: Self-pay | Admitting: Internal Medicine

## 2015-10-03 DIAGNOSIS — R05 Cough: Secondary | ICD-10-CM | POA: Diagnosis not present

## 2015-10-03 DIAGNOSIS — Z23 Encounter for immunization: Secondary | ICD-10-CM | POA: Diagnosis not present

## 2015-10-03 DIAGNOSIS — R058 Other specified cough: Secondary | ICD-10-CM

## 2015-10-03 DIAGNOSIS — J3489 Other specified disorders of nose and nasal sinuses: Secondary | ICD-10-CM | POA: Diagnosis not present

## 2015-10-03 NOTE — Telephone Encounter (Signed)
Patient states she is returning Leslie's phone call from around noon today.  CB [phone]

## 2015-10-03 NOTE — Assessment & Plan Note (Addendum)
Quit smoking 1985  Spirometry 10/02/2015 FEV1  1.28 (65%) ratio 65   So she has mild / mod copd with ? Tendency to aecopd vs recurrent cough /bronchitis from exposures or possible sinus dz (see uacs sep a/p)  For now all she needs is albuterol prn with the option for laba/ics as maintenance if more than one def exac per year or LAMA - the former favored over the latter the more the exac looks like AB and hfa strongly favored over dpi if she masters hfa as dpi assoc with more cough/ her main complaint  - The proper method of use, as well as anticipated side effects, of a metered-dose inhaler are discussed and demonstrated to the patient. Improved effectiveness after extensive coaching during this visit to a level of approximately 75 % from a baseline of 50 %   I had an extended discussion with the patient reviewing all relevant studies completed to date and  Lasting 35 minutes of a 60 minute visit    Each maintenance medication was reviewed in detail including most importantly the difference between maintenance and prns and under what circumstances the prns are to be triggered using an action plan format that is not reflected in the computer generated alphabetically organized AVS.    Please see instructions for details which were reviewed in writing and the patient given a copy highlighting the part that I personally wrote and discussed at today's ov.

## 2015-10-03 NOTE — Progress Notes (Signed)
Quick Note:  LMTCB ______ 

## 2015-10-03 NOTE — Telephone Encounter (Signed)
Result Note     Call patient : Study is unremarkable, no change in recs   I spoke with patient about results and she verbalized understanding and had no questions 

## 2015-10-03 NOTE — Assessment & Plan Note (Signed)
Nasal tone to voice with h/o recurrent cough/ bronchitis ? Related to sinus dz > sinus ct ordered

## 2015-10-24 DIAGNOSIS — L570 Actinic keratosis: Secondary | ICD-10-CM | POA: Diagnosis not present

## 2015-10-24 DIAGNOSIS — L821 Other seborrheic keratosis: Secondary | ICD-10-CM | POA: Diagnosis not present

## 2015-10-24 DIAGNOSIS — L720 Epidermal cyst: Secondary | ICD-10-CM | POA: Diagnosis not present

## 2015-11-09 ENCOUNTER — Institutional Professional Consult (permissible substitution): Payer: Medicare Other | Admitting: Internal Medicine

## 2015-12-21 DIAGNOSIS — M8589 Other specified disorders of bone density and structure, multiple sites: Secondary | ICD-10-CM | POA: Diagnosis not present

## 2015-12-21 DIAGNOSIS — Z1231 Encounter for screening mammogram for malignant neoplasm of breast: Secondary | ICD-10-CM | POA: Diagnosis not present

## 2015-12-21 DIAGNOSIS — Z803 Family history of malignant neoplasm of breast: Secondary | ICD-10-CM | POA: Diagnosis not present

## 2016-02-13 ENCOUNTER — Other Ambulatory Visit: Payer: Self-pay | Admitting: Gastroenterology

## 2016-03-07 ENCOUNTER — Ambulatory Visit
Admission: RE | Admit: 2016-03-07 | Discharge: 2016-03-07 | Disposition: A | Discharge status: Home / Self Care | Payer: Medicare Other | Source: Ambulatory Visit | Attending: Geriatric Medicine | Admitting: Geriatric Medicine

## 2016-03-07 ENCOUNTER — Other Ambulatory Visit: Payer: Self-pay | Admitting: Geriatric Medicine

## 2016-03-07 DIAGNOSIS — M546 Pain in thoracic spine: Secondary | ICD-10-CM

## 2016-03-07 DIAGNOSIS — G8929 Other chronic pain: Secondary | ICD-10-CM | POA: Diagnosis not present

## 2016-03-07 DIAGNOSIS — Z79899 Other long term (current) drug therapy: Secondary | ICD-10-CM | POA: Diagnosis not present

## 2016-03-07 DIAGNOSIS — Z0000 Encounter for general adult medical examination without abnormal findings: Secondary | ICD-10-CM | POA: Diagnosis not present

## 2016-03-07 DIAGNOSIS — Z1389 Encounter for screening for other disorder: Secondary | ICD-10-CM | POA: Diagnosis not present

## 2016-03-07 DIAGNOSIS — J449 Chronic obstructive pulmonary disease, unspecified: Secondary | ICD-10-CM | POA: Diagnosis not present

## 2016-04-16 ENCOUNTER — Encounter (HOSPITAL_COMMUNITY): Payer: Self-pay | Admitting: *Deleted

## 2016-04-21 NOTE — Anesthesia Preprocedure Evaluation (Addendum)
Anesthesia Evaluation  Patient identified by MRN, date of birth, ID band Patient awake    Reviewed: Allergy & Precautions, NPO status , Patient's Chart, lab work & pertinent test results  Airway Mallampati: I  TM Distance: >3 FB Neck ROM: Full    Dental no notable dental hx. (+) Dental Advisory Given   Pulmonary shortness of breath and with exertion, COPD,  COPD inhaler, former smoker,    Pulmonary exam normal        Cardiovascular negative cardio ROS Normal cardiovascular exam     Neuro/Psych negative neurological ROS  negative psych ROS   GI/Hepatic negative GI ROS, Neg liver ROS, GERD  ,  Endo/Other  negative endocrine ROS  Renal/GU negative Renal ROS  negative genitourinary   Musculoskeletal negative musculoskeletal ROS (+) Arthritis ,   Abdominal   Peds negative pediatric ROS (+)  Hematology negative hematology ROS (+)   Anesthesia Other Findings   Reproductive/Obstetrics negative OB ROS                          Anesthesia Physical Anesthesia Plan  ASA: II  Anesthesia Plan: MAC   Post-op Pain Management:    Induction: Intravenous  Airway Management Planned: Nasal Cannula  Additional Equipment: None  Intra-op Plan:   Post-operative Plan:   Informed Consent: I have reviewed the patients History and Physical, chart, labs and discussed the procedure including the risks, benefits and alternatives for the proposed anesthesia with the patient or authorized representative who has indicated his/her understanding and acceptance.   Dental advisory given  Plan Discussed with: Surgeon and CRNA  Anesthesia Plan Comments:         Anesthesia Quick Evaluation

## 2016-04-22 ENCOUNTER — Ambulatory Visit (HOSPITAL_COMMUNITY): Payer: Medicare Other | Admitting: Anesthesiology

## 2016-04-22 ENCOUNTER — Ambulatory Visit (HOSPITAL_COMMUNITY)
Admission: RE | Admit: 2016-04-22 | Discharge: 2016-04-22 | Disposition: A | Discharge status: Home / Self Care | Payer: Medicare Other | Source: Ambulatory Visit | Attending: Gastroenterology | Admitting: Gastroenterology

## 2016-04-22 ENCOUNTER — Encounter (HOSPITAL_COMMUNITY)
Admission: RE | Disposition: A | Discharge status: Home / Self Care | Payer: Self-pay | Source: Ambulatory Visit | Attending: Gastroenterology

## 2016-04-22 ENCOUNTER — Encounter (HOSPITAL_COMMUNITY): Payer: Self-pay

## 2016-04-22 DIAGNOSIS — K219 Gastro-esophageal reflux disease without esophagitis: Secondary | ICD-10-CM | POA: Diagnosis not present

## 2016-04-22 DIAGNOSIS — R1310 Dysphagia, unspecified: Secondary | ICD-10-CM | POA: Diagnosis not present

## 2016-04-22 DIAGNOSIS — M1990 Unspecified osteoarthritis, unspecified site: Secondary | ICD-10-CM | POA: Insufficient documentation

## 2016-04-22 DIAGNOSIS — Z1211 Encounter for screening for malignant neoplasm of colon: Secondary | ICD-10-CM | POA: Insufficient documentation

## 2016-04-22 DIAGNOSIS — J449 Chronic obstructive pulmonary disease, unspecified: Secondary | ICD-10-CM | POA: Insufficient documentation

## 2016-04-22 DIAGNOSIS — K5730 Diverticulosis of large intestine without perforation or abscess without bleeding: Secondary | ICD-10-CM | POA: Insufficient documentation

## 2016-04-22 DIAGNOSIS — Z79899 Other long term (current) drug therapy: Secondary | ICD-10-CM | POA: Insufficient documentation

## 2016-04-22 DIAGNOSIS — Z96653 Presence of artificial knee joint, bilateral: Secondary | ICD-10-CM | POA: Insufficient documentation

## 2016-04-22 DIAGNOSIS — K5790 Diverticulosis of intestine, part unspecified, without perforation or abscess without bleeding: Secondary | ICD-10-CM | POA: Diagnosis not present

## 2016-04-22 HISTORY — DX: Reserved for inherently not codable concepts without codable children: IMO0001

## 2016-04-22 HISTORY — DX: Gastro-esophageal reflux disease without esophagitis: K21.9

## 2016-04-22 HISTORY — PX: ESOPHAGOGASTRODUODENOSCOPY (EGD) WITH PROPOFOL: SHX5813

## 2016-04-22 HISTORY — PX: COLONOSCOPY WITH PROPOFOL: SHX5780

## 2016-04-22 HISTORY — DX: Chronic obstructive pulmonary disease, unspecified: J44.9

## 2016-04-22 HISTORY — DX: Unspecified osteoarthritis, unspecified site: M19.90

## 2016-04-22 SURGERY — COLONOSCOPY WITH PROPOFOL
Anesthesia: Monitor Anesthesia Care

## 2016-04-22 MED ORDER — PROPOFOL 10 MG/ML IV BOLUS
INTRAVENOUS | Status: DC | PRN
  Administered 2016-04-22 (×9): 40 mg via INTRAVENOUS

## 2016-04-22 MED ORDER — SODIUM CHLORIDE 0.9 % IV SOLN: INTRAVENOUS | Status: DC

## 2016-04-22 MED ORDER — LACTATED RINGERS IV SOLN
INTRAVENOUS | Status: DC
  Administered 2016-04-22: 1000 mL via INTRAVENOUS
  Administered 2016-04-22: 12:00:00 via INTRAVENOUS

## 2016-04-22 SURGICAL SUPPLY — 25 items

## 2016-04-22 NOTE — Op Note (Signed)
[facility] Patient Name: [name] Procedure Date: 04/22/2016 MRN: [id number] Attending MD: Garlan Fair , MD Date of Birth: 1941/02/13 CSN: [id number] Age: 75 Admit Type: Outpatient Procedure:                Colonoscopy Indications:              Screening for colorectal malignant neoplasm Providers:                Garlan Fair, MD, Dustin Flock RN, RN, Despina Pole Tech, Technician, Glenis Smoker, CRNA Referring MD:              Medicines:                Propofol per Anesthesia Complications:            No immediate complications. Estimated Blood Loss:     Estimated blood loss: none. Procedure:                Pre-Anesthesia Assessment:                           - Prior to the procedure, a History and Physical                            was performed, and patient medications and                            allergies were reviewed. The patient's tolerance of                            previous anesthesia was also reviewed. The risks                            and benefits of the procedure and the sedation                            options and risks were discussed with the patient.                            All questions were answered, and informed consent                            was obtained. Prior Anticoagulants: The patient has                            taken no previous anticoagulant or antiplatelet                            agents. ASA Grade Assessment: II - A patient with                            mild systemic disease. After reviewing the risks  and benefits, the patient was deemed in                            satisfactory condition to undergo the procedure.                           - Prior to the procedure, a History and Physical                            was performed, and patient medications and                            allergies were reviewed. The patient's tolerance of                  previous anesthesia was also reviewed. The risks                            and benefits of the procedure and the sedation                            options and risks were discussed with the patient.                            All questions were answered, and informed consent                            was obtained. Prior Anticoagulants: The patient has                            taken no previous anticoagulant or antiplatelet                            agents. ASA Grade Assessment: II - A patient with                            mild systemic disease. After reviewing the risks                            and benefits, the patient was deemed in                            satisfactory condition to undergo the procedure.                           After obtaining informed consent, the colonoscope                            was passed under direct vision. Throughout the                            procedure, the patient's blood pressure, pulse, and  oxygen saturations were monitored continuously. The                            [id number]) scope was introduced through                            the anus and advanced to the the cecum, identified                            by appendiceal orifice and ileocecal valve. The                            colonoscopy was performed without difficulty. The                            patient tolerated the procedure well. The quality                            of the bowel preparation was good. The ileocecal                            valve, the appendiceal orifice and the rectum were                            photographed. Scope In: 12:46:25 PM Scope Out: 1:01:04 PM Scope Withdrawal Time: 0 hours 5 minutes 11 seconds  Total Procedure Duration: 0 hours 14 minutes 39 seconds  Findings:      The perianal and digital rectal examinations were normal.      The entire examined colon appeared normal. Left colonic  diverticulosis       was present. Impression:               - The entire examined colon is normal.                           - No specimens collected. Moderate Sedation:      N/A- Per Anesthesia Care Recommendation:           - Patient has a contact number available for                            emergencies. The signs and symptoms of potential                            delayed complications were discussed with the                            patient. Return to normal activities tomorrow.                            Written discharge instructions were provided to the                            patient.                           -  Repeat colonoscopy is not recommended for                            screening purposes.                           - Resume previous diet.                           - Continue present medications. Procedure Code(s):        --- Professional ---                           [id number], Colorectal cancer screening; colonoscopy on                            individual not meeting criteria for high risk Diagnosis Code(s):        --- Professional ---                           Z12.11, Encounter for screening for malignant                            neoplasm of colon CPT copyright 2016 American Medical Association. All rights reserved. The codes documented in this report are preliminary and upon coder review may  be revised to meet current compliance requirements. Earle Gell, MD Garlan Fair, MD 04/22/2016 1:12:00 PM This report has been signed electronically. Number of Addenda: 0

## 2016-04-22 NOTE — Transfer of Care (Signed)
Immediate Anesthesia Transfer of Care Note  Patient: [name]  Procedure(s) Performed: Procedure(s): COLONOSCOPY WITH PROPOFOL (N/A) ESOPHAGOGASTRODUODENOSCOPY (EGD) WITH PROPOFOL (N/A)  Patient Location: PACU and Endoscopy Unit  Anesthesia Type:MAC  Level of Consciousness: awake  Airway & Oxygen Therapy: Patient Spontanous Breathing and Patient connected to nasal cannula oxygen  Post-op Assessment: Report given to RN and Post -op Vital signs reviewed and stable  Post vital signs: Reviewed and stable  Last Vitals:  Vitals:   04/22/16 1139  BP: 140/75  Pulse: 67  Resp: 15  Temp: 36.5 C    Last Pain:  Vitals:   04/22/16 1139  TempSrc: Oral         Complications: No apparent anesthesia complications

## 2016-04-22 NOTE — Op Note (Signed)
[facility] Patient Name: [name] Procedure Date: 04/22/2016 MRN: [id number] Attending MD: Garlan Fair , MD Date of Birth: 02/07/1941 CSN: [id number] Age: 75 Admit Type: Outpatient Procedure:                Upper GI endoscopy Indications:              Dysphagia Providers:                Garlan Fair, MD, Dustin Flock RN, RN, Despina Pole Tech, Technician, Glenis Smoker, CRNA Referring MD:              Medicines:                Propofol per Anesthesia Complications:            No immediate complications. Estimated Blood Loss:     Estimated blood loss: none. Procedure:                Pre-Anesthesia Assessment:                           - Prior to the procedure, a History and Physical                            was performed, and patient medications and                            allergies were reviewed. The patient's tolerance of                            previous anesthesia was also reviewed. The risks                            and benefits of the procedure and the sedation                            options and risks were discussed with the patient.                            All questions were answered, and informed consent                            was obtained. Prior Anticoagulants: The patient has                            taken no previous anticoagulant or antiplatelet                            agents. ASA Grade Assessment: II - A patient with                            mild systemic disease. After reviewing the risks  and benefits, the patient was deemed in                            satisfactory condition to undergo the procedure.                           After obtaining informed consent, the endoscope was                            passed under direct vision. Throughout the                            procedure, the patient's blood pressure, pulse, and                            oxygen  saturations were monitored continuously. The                            Endoscope was introduced through the mouth, and                            advanced to the second part of duodenum. The upper                            GI endoscopy was accomplished without difficulty.                            The patient tolerated the procedure well. Scope In: Scope Out: Findings:      The Z-line was regular and was found 37 cm from the incisors.      The examined esophagus was normal.      The entire examined stomach was normal.      The examined duodenum was normal. Impression:               - Z-line regular, 37 cm from the incisors.                           - Normal esophagus.                           - Normal stomach.                           - Normal examined duodenum.                           - No specimens collected. Moderate Sedation:      N/A- Per Anesthesia Care Recommendation:           - Patient has a contact number available for                            emergencies. The signs and symptoms of potential                            delayed complications were discussed with  the                            patient. Return to normal activities tomorrow.                            Written discharge instructions were provided to the                            patient.                           - Return to primary care physician PRN.                           - Resume previous diet.                           - Continue present medications. Procedure Code(s):        --- Professional ---                           [phone], Esophagogastroduodenoscopy, flexible,                            transoral; diagnostic, including collection of                            specimen(s) by brushing or washing, when performed                            (separate procedure) Diagnosis Code(s):        --- Professional ---                           R13.10, Dysphagia, unspecified CPT copyright 2016 American  Medical Association. All rights reserved. The codes documented in this report are preliminary and upon coder review may  be revised to meet current compliance requirements. Earle Gell, MD Garlan Fair, MD 04/22/2016 1:08:22 PM This report has been signed electronically. Number of Addenda: 0

## 2016-04-22 NOTE — H&P (Signed)
  Procedure: Screening colonoscopy and diagnostic esophagogastroduodenoscopy. Normal screening colonoscopy was performed on 04/04/2005. Intermittent solid food dysphagia. 02/21/2014 barium esophagram with tablet showed transient hang-up of the barium tablet in the distal esophagus.  History: The patient is a 75 year old female born [birth year]. She has chronic gastroesophageal reflux manifested by heartburn and complicated by intermittent solid food esophageal dysphagia. 02/21/2014 barium esophagram with tablet was normal the 13 mm barium tablet was administered and past after approximately 1 minute delay at the gastroesophageal junction. No evidence of stricture, mass, or ulceration was noted.  The patient is scheduled to undergo diagnostic esophagogastroduodenoscopy with possible esophageal stricture dilation followed by screening colonoscopy her graft past medical history: Allergic rhinitis. Osteoarthritis. Hypercholesterolemia. Chronic obstructive pulmonary disease bilateral knee replacement surgeries. Hysterectomy.  Medication allergies: None  Exam: The patient is alert and lying comfortably on the endoscopy stretcher. Abdomen is soft and nontender to palpation. Lungs are clear to auscultation. Cardiac exam reveals a regular rhythm.  Plan: Proceed with diagnostic esophagogastroduodenoscopy, possible esophageal stricture dilation, and screening colonoscopy

## 2016-04-22 NOTE — Discharge Instructions (Signed)
Esophagogastroduodenoscopy, Care After Refer to this sheet in the next few weeks. These instructions provide you with information about caring for yourself after your procedure. Your health care provider may also give you more specific instructions. Your treatment has been planned according to current medical practices, but problems sometimes occur. Call your health care provider if you have any problems or questions after your procedure. WHAT TO EXPECT AFTER THE PROCEDURE After your procedure, it is typical to feel:  Soreness in your throat.  Pain with swallowing.  Sick to your stomach (nauseous).  Bloated.  Dizzy.  Fatigued. HOME CARE INSTRUCTIONS  Do not eat or drink anything until the numbing medicine (local anesthetic) has worn off and your gag reflex has returned. You will know that the local anesthetic has worn off when you can swallow comfortably.  Do not drive or operate machinery until directed by your health care provider.  Take medicines only as directed by your health care provider. SEEK MEDICAL CARE IF:   You cannot stop coughing.  You are not urinating at all or less than usual. SEEK IMMEDIATE MEDICAL CARE IF:  You have difficulty swallowing.  You cannot eat or drink.  You have worsening throat or chest pain.  You have dizziness or lightheadedness or you faint.  You have nausea or vomiting.  You have chills.  You have a fever.  You have severe abdominal pain.  You have black, tarry, or bloody stools.   This information is not intended to replace advice given to you by your health care provider. Make sure you discuss any questions you have with your health care provider.   Document Released: 08/19/2012 Document Revised: 09/23/2014 Document Reviewed: 08/19/2012 Elsevier Interactive Patient Education [employer].   Colonoscopy A colonoscopy is an exam to look at your colon. This exam can help find lumps (tumors), growths (polyps), bleeding,  and redness and puffiness (inflammation) in your colon.  BEFORE THE PROCEDURE  Ask your doctor about changing or stopping your regular medicines.  You may need to drink a large amount of a special liquid (oral bowel prep). You start drinking this the day before your procedure. It will cause you to have watery poop (stool). This cleans out your colon.  Do not eat or drink anything else once you have started the bowel prep, unless your doctor tells you it is safe to do so.  Make plans for someone to drive you home after the procedure. PROCEDURE  You will be given medicine to help you relax (sedative).  You will lie on your side with your knees bent.  A tube with a camera on the end is put in the opening of your butt (anus) and into your colon. Pictures are sent to a computer screen. Your doctor will look for anything that is not normal.  Your doctor may take a tissue sample (biopsy) from your colon to be looked at more closely.  The exam is finished when your doctor has viewed all of the colon. AFTER THE PROCEDURE  Do not drive for 24 hours after the exam.  You may have a small amount of blood in your poop. This is normal.  You may pass gas and have belly (abdominal) cramps. This is normal.  Ask when your test results will be ready. Make sure you get your test results.   This information is not intended to replace advice given to you by your health care provider. Make sure you discuss any questions you have with your  health care provider.   Document Released: 10/05/2010 Document Revised: 09/07/2013 Document Reviewed: 05/10/2013 Elsevier Interactive Patient Education [employer].

## 2016-04-22 NOTE — Anesthesia Postprocedure Evaluation (Signed)
Anesthesia Post Note  Patient: DELORISE ISENHOWER  Procedure(s) Performed: Procedure(s) (LRB): COLONOSCOPY WITH PROPOFOL (N/A) ESOPHAGOGASTRODUODENOSCOPY (EGD) WITH PROPOFOL (N/A)  Patient location during evaluation: PACU Anesthesia Type: MAC Level of consciousness: awake and alert Pain management: pain level controlled Vital Signs Assessment: post-procedure vital signs reviewed and stable Respiratory status: spontaneous breathing, nonlabored ventilation, respiratory function stable and patient connected to nasal cannula oxygen Cardiovascular status: stable and blood pressure returned to baseline Anesthetic complications: no    Last Vitals:  Vitals:   04/22/16 1139 04/22/16 1310  BP: 140/75 (!) 114/53  Pulse: 67 72  Resp: 15 16  Temp: 36.5 C 36.6 C    Last Pain:  Vitals:   04/22/16 1310  TempSrc: Oral                 Reginal Lutes

## 2016-04-23 ENCOUNTER — Encounter (HOSPITAL_COMMUNITY): Payer: Self-pay | Admitting: Gastroenterology

## 2016-06-18 DIAGNOSIS — Z23 Encounter for immunization: Secondary | ICD-10-CM | POA: Diagnosis not present

## 2016-10-23 DIAGNOSIS — H01004 Unspecified blepharitis left upper eyelid: Secondary | ICD-10-CM | POA: Diagnosis not present

## 2016-10-23 DIAGNOSIS — H01001 Unspecified blepharitis right upper eyelid: Secondary | ICD-10-CM | POA: Diagnosis not present

## 2016-10-23 DIAGNOSIS — H01002 Unspecified blepharitis right lower eyelid: Secondary | ICD-10-CM | POA: Diagnosis not present

## 2016-10-23 DIAGNOSIS — H01005 Unspecified blepharitis left lower eyelid: Secondary | ICD-10-CM | POA: Diagnosis not present

## 2016-10-24 DIAGNOSIS — D225 Melanocytic nevi of trunk: Secondary | ICD-10-CM | POA: Diagnosis not present

## 2016-10-24 DIAGNOSIS — D224 Melanocytic nevi of scalp and neck: Secondary | ICD-10-CM | POA: Diagnosis not present

## 2016-10-24 DIAGNOSIS — L812 Freckles: Secondary | ICD-10-CM | POA: Diagnosis not present

## 2016-10-24 DIAGNOSIS — D2261 Melanocytic nevi of right upper limb, including shoulder: Secondary | ICD-10-CM | POA: Diagnosis not present

## 2016-10-24 DIAGNOSIS — D220 Melanocytic nevi of lip: Secondary | ICD-10-CM | POA: Diagnosis not present

## 2016-10-24 DIAGNOSIS — L821 Other seborrheic keratosis: Secondary | ICD-10-CM | POA: Diagnosis not present

## 2016-10-24 DIAGNOSIS — D1801 Hemangioma of skin and subcutaneous tissue: Secondary | ICD-10-CM | POA: Diagnosis not present

## 2017-01-15 DIAGNOSIS — Z1231 Encounter for screening mammogram for malignant neoplasm of breast: Secondary | ICD-10-CM | POA: Diagnosis not present

## 2017-02-12 DIAGNOSIS — H52203 Unspecified astigmatism, bilateral: Secondary | ICD-10-CM | POA: Diagnosis not present

## 2017-02-12 DIAGNOSIS — H2513 Age-related nuclear cataract, bilateral: Secondary | ICD-10-CM | POA: Diagnosis not present

## 2017-03-01 DIAGNOSIS — H6122 Impacted cerumen, left ear: Secondary | ICD-10-CM | POA: Diagnosis not present

## 2017-04-17 DIAGNOSIS — H2511 Age-related nuclear cataract, right eye: Secondary | ICD-10-CM | POA: Diagnosis not present

## 2017-04-17 DIAGNOSIS — H25811 Combined forms of age-related cataract, right eye: Secondary | ICD-10-CM | POA: Diagnosis not present

## 2017-04-28 DIAGNOSIS — S134XXA Sprain of ligaments of cervical spine, initial encounter: Secondary | ICD-10-CM | POA: Diagnosis not present

## 2017-04-28 DIAGNOSIS — J449 Chronic obstructive pulmonary disease, unspecified: Secondary | ICD-10-CM | POA: Diagnosis not present

## 2017-04-28 DIAGNOSIS — Z0000 Encounter for general adult medical examination without abnormal findings: Secondary | ICD-10-CM | POA: Diagnosis not present

## 2017-04-28 DIAGNOSIS — M810 Age-related osteoporosis without current pathological fracture: Secondary | ICD-10-CM | POA: Diagnosis not present

## 2017-04-28 DIAGNOSIS — E7800 Pure hypercholesterolemia, unspecified: Secondary | ICD-10-CM | POA: Diagnosis not present

## 2017-04-28 DIAGNOSIS — Z79899 Other long term (current) drug therapy: Secondary | ICD-10-CM | POA: Diagnosis not present

## 2017-05-08 DIAGNOSIS — H25812 Combined forms of age-related cataract, left eye: Secondary | ICD-10-CM | POA: Diagnosis not present

## 2017-05-08 DIAGNOSIS — H2512 Age-related nuclear cataract, left eye: Secondary | ICD-10-CM | POA: Diagnosis not present

## 2017-07-04 DIAGNOSIS — Z23 Encounter for immunization: Secondary | ICD-10-CM | POA: Diagnosis not present

## 2017-11-10 DIAGNOSIS — L812 Freckles: Secondary | ICD-10-CM | POA: Diagnosis not present

## 2017-11-10 DIAGNOSIS — L820 Inflamed seborrheic keratosis: Secondary | ICD-10-CM | POA: Diagnosis not present

## 2017-11-10 DIAGNOSIS — L570 Actinic keratosis: Secondary | ICD-10-CM | POA: Diagnosis not present

## 2017-11-10 DIAGNOSIS — D1801 Hemangioma of skin and subcutaneous tissue: Secondary | ICD-10-CM | POA: Diagnosis not present

## 2017-11-10 DIAGNOSIS — L821 Other seborrheic keratosis: Secondary | ICD-10-CM | POA: Diagnosis not present

## 2017-11-10 DIAGNOSIS — D692 Other nonthrombocytopenic purpura: Secondary | ICD-10-CM | POA: Diagnosis not present

## 2017-11-10 DIAGNOSIS — D220 Melanocytic nevi of lip: Secondary | ICD-10-CM | POA: Diagnosis not present

## 2018-01-16 ENCOUNTER — Telehealth: Payer: Self-pay | Admitting: Internal Medicine

## 2018-01-16 DIAGNOSIS — L820 Inflamed seborrheic keratosis: Secondary | ICD-10-CM | POA: Diagnosis not present

## 2018-01-16 DIAGNOSIS — D485 Neoplasm of uncertain behavior of skin: Secondary | ICD-10-CM | POA: Diagnosis not present

## 2018-01-16 DIAGNOSIS — L821 Other seborrheic keratosis: Secondary | ICD-10-CM | POA: Diagnosis not present

## 2018-01-16 NOTE — Telephone Encounter (Signed)
Called and spoke with pt who is wanting to switch providers from Dr. Melvyn Novas to Dr. Lake Bells.  Dr. Melvyn Novas, please advise if you are okay with pt switching to Dr. Lake Bells and Dr. Lake Bells, please advise if you are fine taking over pt's care.  Thanks!

## 2018-01-16 NOTE — Telephone Encounter (Signed)
Fine with me

## 2018-01-17 NOTE — Telephone Encounter (Signed)
OK, 30 min only 

## 2018-01-19 NOTE — Telephone Encounter (Signed)
Noted by triage.  °Will close encounter.  °

## 2018-01-19 NOTE — Telephone Encounter (Signed)
Patient returned call.  She was scheduled to see BQ on 06/11, first available.  No call back is needed.

## 2018-01-19 NOTE — Telephone Encounter (Signed)
Attempted to call patient to schedule OV in a 30 min slot with BQ. Patient didn't answer, left message to call back.

## 2018-02-04 DIAGNOSIS — Z803 Family history of malignant neoplasm of breast: Secondary | ICD-10-CM | POA: Diagnosis not present

## 2018-02-04 DIAGNOSIS — Z1231 Encounter for screening mammogram for malignant neoplasm of breast: Secondary | ICD-10-CM | POA: Diagnosis not present

## 2018-02-06 ENCOUNTER — Ambulatory Visit (INDEPENDENT_AMBULATORY_CARE_PROVIDER_SITE_OTHER): Payer: Medicare Other | Admitting: Pulmonary Disease

## 2018-02-06 ENCOUNTER — Ambulatory Visit (INDEPENDENT_AMBULATORY_CARE_PROVIDER_SITE_OTHER)
Admission: RE | Admit: 2018-02-06 | Discharge: 2018-02-06 | Disposition: A | Discharge status: Home / Self Care | Payer: Medicare Other | Source: Ambulatory Visit | Attending: Pulmonary Disease | Admitting: Pulmonary Disease

## 2018-02-06 ENCOUNTER — Encounter: Payer: Self-pay | Admitting: Pulmonary Disease

## 2018-02-06 VITALS — BP 122/78 | HR 67 | Ht 63.5 in | Wt 145.2 lb

## 2018-02-06 DIAGNOSIS — R05 Cough: Secondary | ICD-10-CM | POA: Diagnosis not present

## 2018-02-06 DIAGNOSIS — J301 Allergic rhinitis due to pollen: Secondary | ICD-10-CM | POA: Diagnosis not present

## 2018-02-06 DIAGNOSIS — J449 Chronic obstructive pulmonary disease, unspecified: Secondary | ICD-10-CM | POA: Diagnosis not present

## 2018-02-06 DIAGNOSIS — R0600 Dyspnea, unspecified: Secondary | ICD-10-CM

## 2018-02-06 DIAGNOSIS — R0602 Shortness of breath: Secondary | ICD-10-CM | POA: Diagnosis not present

## 2018-02-06 MED ORDER — UMECLIDINIUM-VILANTEROL 62.5-25 MCG/INH IN AEPB: 1.0000 | INHALATION_SPRAY | Freq: Every day | RESPIRATORY_TRACT | 0 refills | Status: DC

## 2018-02-06 MED ORDER — UMECLIDINIUM-VILANTEROL 62.5-25 MCG/INH IN AEPB: 1.0000 | INHALATION_SPRAY | Freq: Every day | RESPIRATORY_TRACT | 10 refills | Status: DC

## 2018-02-06 NOTE — Patient Instructions (Signed)
Shortness of breath: We will check a chest x-ray  COPD: Gold grade B is your disease classification Start taking Anoro 1 puff daily no matter how you feel Use albuterol 2 puffs every 4-6 hours as needed for chest tightness wheezing or shortness of breath Call us in a week to let us know how you are doing with the Anoro We will refer you to pulmonary rehab with [facility]  Allergic rhinitis: Use Neil Med rinses with distilled water at least twice per day using the instructions on the package. 1/2 hour after using the [facility] Med rinse, use Nasacort two puffs in each nostril once per day.  Remember that the Nasacort can take 1-2 weeks to work after regular use. Use generic zyrtec (cetirizine) every day.  If this doesn't help, then stop taking it and use chlorpheniramine-phenylephrine combination tablets.   We will see you back in about 6 weeks or sooner if needed

## 2018-02-06 NOTE — Progress Notes (Signed)
Synopsis: COPD; Quit smoking 1985.  She smoked 1 ppd until age 77, 61 years total.  Subjective:   PATIENT ID: [name] GENDER: female DOB: [birth year], MRN: [id number]   HPI  Chief Complaint  Patient presents with  . Follow-up    C/o had flair up 1 wk. ago, lasted 3-4 wks.Sob with exertion worse,clears throat occass.,cough occass. clear and yellow,wheezing. No  fcs.Finished Doxycyclline 2 wks. ago.    COPD: > two years ago she was told that she may not have COPD > She questions the diagnosis > she says that she was "perfectly well" until recently when she noticed dyspnea when she would walk just short distances in the house > she notices more "whistling and crackles" > she called her internist who prescribed an antibiotic which helped > this flare up happened about a month ago > around the time her husband had a cold > she reported no fever, no chills > she had some "sinus congestion and coughing, coughing dyspnea"  Dyspnea: > just in the last two months the family has noticed more dyspnea > climbing a hill has been more difficult  She does water aerobics several times a week and a balance class.  She typically walks a mile or two several times a years.  No other bronchitis apart from this spell.  There has been no change to the living environment.  About two years ago there was a concern about kyphosis.    Past Medical History:  Diagnosis Date  . Arthritis   . COPD (chronic obstructive pulmonary disease) ([place])   . GERD (gastroesophageal reflux disease)   . Shortness of breath dyspnea    occassional with the COPD     No family history on file.   Social History   Socioeconomic History  . Marital status: Married    Spouse name: Not on file  . Number of children: Not on file  . Years of education: Not on file  . Highest education level: Not on file  Occupational History  . Occupation: Retired  Scientific laboratory technician  . Financial resource strain: Not on file  . Food  insecurity:    Worry: Not on file    Inability: Not on file  . Transportation needs:    Medical: Not on file    Non-medical: Not on file  Tobacco Use  . Smoking status: Former Smoker    Packs/day: 1.00    Years: 27.00    Pack years: 27.00    Types: Cigarettes    Last attempt to quit: 09/17/1983    Years since quitting: 34.4  . Smokeless tobacco: Never Used  Substance and Sexual Activity  . Alcohol use: Yes    Alcohol/week: 4.2 - 8.4 oz    Types: 7 - 14 Glasses of wine per week  . Drug use: No  . Sexual activity: Not on file  Lifestyle  . Physical activity:    Days per week: Not on file    Minutes per session: Not on file  . Stress: Not on file  Relationships  . Social connections:    Talks on phone: Not on file    Gets together: Not on file    Attends religious service: Not on file    Active member of club or organization: Not on file    Attends meetings of clubs or organizations: Not on file    Relationship status: Not on file  . Intimate partner violence:    Fear of current or  ex partner: Not on file    Emotionally abused: Not on file    Physically abused: Not on file    Forced sexual activity: Not on file  Other Topics Concern  . Not on file  Social History Narrative  . Not on file     No Known Allergies   Outpatient Medications Prior to Visit  Medication Sig Dispense Refill  . albuterol (PROAIR HFA) 108 (90 Base) MCG/ACT inhaler Inhale 2 puffs into the lungs every 6 (six) hours as needed for wheezing or shortness of breath.    . ergocalciferol (VITAMIN D2) 50000 units capsule Take 50,000 Units by mouth as directed.    . Multiple Vitamin (MULTIVITAMIN) capsule Take 1 capsule by mouth daily.    . raloxifene (EVISTA) 60 MG tablet Take 60 mg by mouth daily.     No facility-administered medications prior to visit.     Review of Systems  Constitutional: Negative for chills, fever, malaise/fatigue and weight loss.  HENT: Positive for congestion. Negative for  nosebleeds, sinus pain and sore throat.   Eyes: Negative for photophobia, pain and discharge.  Respiratory: Positive for cough, shortness of breath and wheezing. Negative for hemoptysis and sputum production.   Cardiovascular: Negative for chest pain, palpitations, orthopnea and leg swelling.  Gastrointestinal: Negative for abdominal pain, constipation, diarrhea, nausea and vomiting.  Genitourinary: Negative for dysuria, frequency, hematuria and urgency.  Musculoskeletal: Negative for back pain, joint pain, myalgias and neck pain.  Skin: Negative for itching and rash.  Neurological: Negative for tingling, tremors, sensory change, speech change, focal weakness, seizures, weakness and headaches.  Psychiatric/Behavioral: Negative for memory loss, substance abuse and suicidal ideas. The patient is not nervous/anxious.       Objective:  Physical Exam   Vitals:   02/06/18 1049  BP: 122/78  Pulse: 67  SpO2: 99%  Weight: 145 lb 3.2 oz (65.9 kg)  Height: 5' 3.5" (1.613 m)   RA  Gen: well appearing, no acute distress HENT: NCAT, OP clear, neck supple without masses Eyes: PERRL, EOMi Lymph: no cervical lymphadenopathy PULM: CTA B CV: RRR, no mgr, no JVD GI: BS+, soft, nontender, no hsm Derm: no rash or skin breakdown MSK: kyphosis noted, normal bulk and tone Neuro: A&Ox4, CN II-XII intact, strength 5/5 in all 4 extremities Psyche: normal mood and affect   CBC    Component Value Date/Time   WBC 10.8 (H) 06/13/2011 0424   RBC 3.17 (L) 06/13/2011 0424   HGB 10.1 (L) 06/13/2011 0424   HCT 28.9 (L) 06/13/2011 0424   PLT 185 06/13/2011 0424   MCV 91.2 06/13/2011 0424   MCH 31.9 06/13/2011 0424   MCHC 34.9 06/13/2011 0424   RDW 13.0 06/13/2011 0424     Chest imaging:  PFT:  Spirometry 10/02/2015 FEV1  1.28 (65%) ratio 65   Labs:  Path:  Echo:  Heart Catheterization:       Assessment & Plan:   Dyspnea, unspecified type - Plan: DG Chest 2 View, Ambulatory  referral to Physical Therapy  COPD GOLD II  - Plan: Ambulatory referral to Physical Therapy  Allergic rhinitis due to pollen, unspecified seasonality  Discussion: [name] comes to clinic today for evaluation of COPD.  For the last 2 years she was fine though for the last several months she is noticed increasing dyspnea on exertion and she had a flare of COPD.  Physical exam today is within normal limits with the exception of some kyphosis.  My suspicion is is that the  natural progression of COPD has finally caught up with her and she is now at a point where she is symptomatic from it.  I am also concerned about the recent exacerbation though as it is the only one she has had in 1 year I do not think merits further work-up other than adding new treatment.  She would benefit from the addition of dual long-acting bronchodilators.  We will give her a sample and prescription for Anoro.  Because of her smoking history we will check a chest x-ray to make sure there is nothing else going on.  Plan: Shortness of breath: We will check a chest x-ray  COPD: Gold grade B is your disease classification Start taking Anoro 1 puff daily no matter how you feel Use albuterol 2 puffs every 4-6 hours as needed for chest tightness wheezing or shortness of breath Call us in a week to let us know how you are doing with the Anoro We will refer you to pulmonary rehab with [facility]  Allergic rhinitis: Use Neil Med rinses with distilled water at least twice per day using the instructions on the package. 1/2 hour after using the [facility] Med rinse, use Nasacort two puffs in each nostril once per day.  Remember that the Nasacort can take 1-2 weeks to work after regular use. Use generic zyrtec (cetirizine) every day.  If this doesn't help, then stop taking it and use chlorpheniramine-phenylephrine combination tablets.   We will see you back in about 6 weeks or sooner if needed  30 min spent with patient, 40 min  visit   Current Outpatient Medications:  .  albuterol (PROAIR HFA) 108 (90 Base) MCG/ACT inhaler, Inhale 2 puffs into the lungs every 6 (six) hours as needed for wheezing or shortness of breath., Disp: , Rfl:  .  ergocalciferol (VITAMIN D2) 50000 units capsule, Take 50,000 Units by mouth as directed., Disp: , Rfl:  .  Multiple Vitamin (MULTIVITAMIN) capsule, Take 1 capsule by mouth daily., Disp: , Rfl:  .  raloxifene (EVISTA) 60 MG tablet, Take 60 mg by mouth daily., Disp: , Rfl:  .  umeclidinium-vilanterol (ANORO ELLIPTA) 62.5-25 MCG/INH AEPB, Inhale 1 puff into the lungs daily., Disp: 1 each, Rfl: 10

## 2018-02-13 ENCOUNTER — Encounter: Payer: Self-pay | Admitting: *Deleted

## 2018-02-13 ENCOUNTER — Telehealth: Payer: Self-pay | Admitting: Pulmonary Disease

## 2018-02-13 NOTE — Telephone Encounter (Signed)
Atc, na and no option given to leave msg

## 2018-02-16 ENCOUNTER — Encounter: Payer: Self-pay | Admitting: *Deleted

## 2018-02-16 NOTE — Telephone Encounter (Signed)
Called and spoke with Patient about Anoro.  She states that she feels that her breathing is doing much better and feels good.  She does still have an occasional nonproductive cough.  She does want a copy of her CXR at her next visit 7/10 if possible.

## 2018-02-16 NOTE — Telephone Encounter (Signed)
Attempted to call patient, no answer, left message to call back.  

## 2018-02-17 NOTE — Telephone Encounter (Signed)
Noted, thanks!

## 2018-02-17 NOTE — Telephone Encounter (Signed)
FYI BQ

## 2018-02-24 ENCOUNTER — Ambulatory Visit: Payer: Self-pay | Admitting: Pulmonary Disease

## 2018-03-02 ENCOUNTER — Other Ambulatory Visit: Payer: Self-pay | Admitting: Pulmonary Disease

## 2018-03-02 MED ORDER — UMECLIDINIUM-VILANTEROL 62.5-25 MCG/INH IN AEPB: 1.0000 | INHALATION_SPRAY | Freq: Every day | RESPIRATORY_TRACT | 3 refills | Status: DC

## 2018-03-13 DIAGNOSIS — K209 Esophagitis, unspecified: Secondary | ICD-10-CM | POA: Diagnosis not present

## 2018-03-13 DIAGNOSIS — R072 Precordial pain: Secondary | ICD-10-CM | POA: Diagnosis not present

## 2018-03-25 ENCOUNTER — Encounter: Payer: Self-pay | Admitting: Pulmonary Disease

## 2018-03-25 ENCOUNTER — Ambulatory Visit (INDEPENDENT_AMBULATORY_CARE_PROVIDER_SITE_OTHER): Payer: Medicare Other | Admitting: Pulmonary Disease

## 2018-03-25 VITALS — BP 104/70 | HR 80 | Ht 62.7 in | Wt 144.0 lb

## 2018-03-25 DIAGNOSIS — J449 Chronic obstructive pulmonary disease, unspecified: Secondary | ICD-10-CM

## 2018-03-25 NOTE — Progress Notes (Signed)
Synopsis: COPD; Quit smoking 1985.  She smoked 1 ppd until age 77, 41 years total.  Subjective:   PATIENT ID: [name] GENDER: female DOB: [birth year], MRN: [id number]   HPI  Chief Complaint  Patient presents with  . Follow-up   Aram Beecham says that she is doing well.  Initially after starting the Anoro she was a little queezy and eventually was diagnosed with GERD.  She feels that it is helping her climb the hills.  Her exercising is going well.  She saw Jenny Reichmann with [facility] and she wants to switch to the physical therapist affiliated with [facility].  No problems with chest congestion or tightness, no wheezing.  She only occasionally will produce mucus.    Past Medical History:  Diagnosis Date  . Arthritis   . COPD (chronic obstructive pulmonary disease) ([place])   . GERD (gastroesophageal reflux disease)   . Shortness of breath dyspnea    occassional with the COPD      Review of Systems  Constitutional: Negative for chills, fever, malaise/fatigue and weight loss.  HENT: Positive for congestion. Negative for nosebleeds, sinus pain and sore throat.   Eyes: Negative for photophobia, pain and discharge.  Respiratory: Positive for cough, shortness of breath and wheezing. Negative for hemoptysis and sputum production.   Cardiovascular: Negative for chest pain, palpitations, orthopnea and leg swelling.  Gastrointestinal: Negative for abdominal pain, constipation, diarrhea, nausea and vomiting.  Genitourinary: Negative for dysuria, frequency, hematuria and urgency.  Musculoskeletal: Negative for back pain, joint pain, myalgias and neck pain.  Skin: Negative for itching and rash.  Neurological: Negative for tingling, tremors, sensory change, speech change, focal weakness, seizures, weakness and headaches.  Psychiatric/Behavioral: Negative for memory loss, substance abuse and suicidal ideas. The patient is not nervous/anxious.       Objective:  Physical Exam   Vitals:   03/25/18 1039  BP: 104/70  Pulse: 80  SpO2: 93%  Weight: 144 lb (65.3 kg)  Height: 5' 2.7" (1.593 m)   RA  Gen: well appearing HENT: OP clear, TM's clear, neck supple PULM: CTA B, normal percussion CV: RRR, no mgr, trace edema GI: BS+, soft, nontender Derm: no cyanosis or rash Psyche: normal mood and affect    CBC    Component Value Date/Time   WBC 10.8 (H) 06/13/2011 0424   RBC 3.17 (L) 06/13/2011 0424   HGB 10.1 (L) 06/13/2011 0424   HCT 28.9 (L) 06/13/2011 0424   PLT 185 06/13/2011 0424   MCV 91.2 06/13/2011 0424   MCH 31.9 06/13/2011 0424   MCHC 34.9 06/13/2011 0424   RDW 13.0 06/13/2011 0424     Chest imaging:  PFT:  Spirometry 10/02/2015 FEV1  1.28 (65%) ratio 65   Labs:  Path:  Echo:  Heart Catheterization:       Assessment & Plan:   COPD GOLD II  - Plan: Ambulatory referral to Physical Therapy  Discussion: Aram Beecham says she has been doing well since the last visit.  She started taking Anoro and the only trouble she has noticed has been a dry cough from time to time but this has not been a major problem for her.  In fact she feels better she says her breathing has improved.  She is exercising more frequently.  Plan: COPD: Keep taking Anoro 1 puff daily no matter how you feel Get a flu shot when they become available, I recommend the high dose Practice good hand hygiene Continue to exercise with pulmonary rehab-we will send  a referral to the physical therapist at your assisted living facility  We will see you back in 6 months or sooner if needed   Current Outpatient Medications:  .  albuterol (PROAIR HFA) 108 (90 Base) MCG/ACT inhaler, Inhale 2 puffs into the lungs every 6 (six) hours as needed for wheezing or shortness of breath., Disp: , Rfl:  .  ergocalciferol (VITAMIN D2) 50000 units capsule, Take 50,000 Units by mouth as directed., Disp: , Rfl:  .  fluticasone (FLONASE) 50 MCG/ACT nasal spray, Place 2 sprays into both nostrils daily as  needed for allergies or rhinitis., Disp: , Rfl:  .  Multiple Vitamin (MULTIVITAMIN) capsule, Take 1 capsule by mouth daily., Disp: , Rfl:  .  omeprazole (PRILOSEC) 20 MG capsule, Take 20 mg by mouth daily., Disp: , Rfl:  .  raloxifene (EVISTA) 60 MG tablet, Take 60 mg by mouth daily., Disp: , Rfl:  .  umeclidinium-vilanterol (ANORO ELLIPTA) 62.5-25 MCG/INH AEPB, Inhale 1 puff into the lungs daily., Disp: 3 each, Rfl: 3

## 2018-03-25 NOTE — Patient Instructions (Signed)
COPD: Keep taking Anoro 1 puff daily no matter how you feel Get a flu shot when they become available, I recommend the high dose Practice good hand hygiene Continue to exercise with pulmonary rehab-we will send a referral to the physical therapist at your assisted living facility  We will see you back in 6 months or sooner if

## 2018-04-07 DIAGNOSIS — J449 Chronic obstructive pulmonary disease, unspecified: Secondary | ICD-10-CM | POA: Diagnosis not present

## 2018-04-07 DIAGNOSIS — R0609 Other forms of dyspnea: Secondary | ICD-10-CM | POA: Diagnosis not present

## 2018-04-07 DIAGNOSIS — R2689 Other abnormalities of gait and mobility: Secondary | ICD-10-CM | POA: Diagnosis not present

## 2018-05-01 DIAGNOSIS — Z79899 Other long term (current) drug therapy: Secondary | ICD-10-CM | POA: Diagnosis not present

## 2018-05-01 DIAGNOSIS — E7800 Pure hypercholesterolemia, unspecified: Secondary | ICD-10-CM | POA: Diagnosis not present

## 2018-05-01 DIAGNOSIS — Z0000 Encounter for general adult medical examination without abnormal findings: Secondary | ICD-10-CM | POA: Diagnosis not present

## 2018-05-01 DIAGNOSIS — M8580 Other specified disorders of bone density and structure, unspecified site: Secondary | ICD-10-CM | POA: Diagnosis not present

## 2018-05-01 DIAGNOSIS — J449 Chronic obstructive pulmonary disease, unspecified: Secondary | ICD-10-CM | POA: Diagnosis not present

## 2018-05-01 DIAGNOSIS — K219 Gastro-esophageal reflux disease without esophagitis: Secondary | ICD-10-CM | POA: Diagnosis not present

## 2018-05-01 DIAGNOSIS — Z13820 Encounter for screening for osteoporosis: Secondary | ICD-10-CM | POA: Diagnosis not present

## 2018-05-01 DIAGNOSIS — Z1389 Encounter for screening for other disorder: Secondary | ICD-10-CM | POA: Diagnosis not present

## 2018-05-06 DIAGNOSIS — H52203 Unspecified astigmatism, bilateral: Secondary | ICD-10-CM | POA: Diagnosis not present

## 2018-05-06 DIAGNOSIS — H35372 Puckering of macula, left eye: Secondary | ICD-10-CM | POA: Diagnosis not present

## 2018-05-06 DIAGNOSIS — Z961 Presence of intraocular lens: Secondary | ICD-10-CM | POA: Diagnosis not present

## 2018-05-19 DIAGNOSIS — M8588 Other specified disorders of bone density and structure, other site: Secondary | ICD-10-CM | POA: Diagnosis not present

## 2018-09-23 ENCOUNTER — Ambulatory Visit (INDEPENDENT_AMBULATORY_CARE_PROVIDER_SITE_OTHER): Payer: Medicare Other | Admitting: Pulmonary Disease

## 2018-09-23 ENCOUNTER — Encounter: Payer: Self-pay | Admitting: Pulmonary Disease

## 2018-09-23 VITALS — BP 124/72 | HR 61 | Ht 62.5 in | Wt 146.0 lb

## 2018-09-23 DIAGNOSIS — J301 Allergic rhinitis due to pollen: Secondary | ICD-10-CM | POA: Diagnosis not present

## 2018-09-23 DIAGNOSIS — J449 Chronic obstructive pulmonary disease, unspecified: Secondary | ICD-10-CM

## 2018-09-23 DIAGNOSIS — R0600 Dyspnea, unspecified: Secondary | ICD-10-CM

## 2018-09-23 NOTE — Patient Instructions (Signed)
COPD: Please let me know if you ever have chest congestion, mucus production or shortness of breath Keep taking Anoro 1 puff daily no matter how you feel Practice good hand hygiene Stay active I am glad that your flu shot is up to date  Gastroesophageal reflux disease: Given your history of prior smoking and symptoms of trouble swallowing which you have had in the past then I think it is a good idea for you to continue seeing a gastroenterologist  We will see you back in 6 months or sooner if needed

## 2018-09-23 NOTE — Progress Notes (Signed)
Synopsis: COPD; Quit smoking 1985.  She smoked 1 ppd until age 78, 67 years total.  Subjective:   PATIENT ID: [name] GENDER: female DOB: Nov 11, 1940, MRN: [id number]   HPI  Chief Complaint  Patient presents with  . Follow-up    pt states she is doing well currently.    Aram Beecham says that she had a flareup of COPD in November and an antibiotic was called in.  At that time she had a lot of cough and some shortness of breath and was producing clear mucus.  However, this resolved and she now feels back to baseline.  She still has some shortness of breath when she climbs a hill but in general she says that her COPD symptoms are quite well controlled.  She does note a cough which seems to be independent of chest congestion and shortness of breath.  She wonders if this is related to reflux.  She says that she does still experience some heartburn from time to time but not every day.  She did wean herself off of Prilosec and she has a lot of questions about that.  She says that she does have dysphagia from time to time.    Past Medical History:  Diagnosis Date  . Arthritis   . COPD (chronic obstructive pulmonary disease) ([place])   . GERD (gastroesophageal reflux disease)   . Shortness of breath dyspnea    occassional with the COPD      Review of Systems  Constitutional: Negative for chills, fever, malaise/fatigue and weight loss.  HENT: Positive for congestion. Negative for nosebleeds, sinus pain and sore throat.   Eyes: Negative for photophobia, pain and discharge.  Respiratory: Positive for cough, shortness of breath and wheezing. Negative for hemoptysis and sputum production.   Cardiovascular: Negative for chest pain, palpitations, orthopnea and leg swelling.  Gastrointestinal: Negative for abdominal pain, constipation, diarrhea, nausea and vomiting.  Genitourinary: Negative for dysuria, frequency, hematuria and urgency.  Musculoskeletal: Negative for back pain, joint pain,  myalgias and neck pain.  Skin: Negative for itching and rash.  Neurological: Negative for tingling, tremors, sensory change, speech change, focal weakness, seizures, weakness and headaches.  Psychiatric/Behavioral: Negative for memory loss, substance abuse and suicidal ideas. The patient is not nervous/anxious.       Objective:  Physical Exam   Vitals:   09/23/18 1054  BP: 124/72  Pulse: 61  SpO2: 98%  Weight: 146 lb (66.2 kg)  Height: 5' 2.5" (1.588 m)   RA  Gen: well appearing HENT: OP clear, TM's clear, neck supple PULM: CTA B, normal percussion CV: RRR, no mgr, trace edema GI: BS+, soft, nontender Derm: no cyanosis or rash Psyche: normal mood and affect     CBC    Component Value Date/Time   WBC 10.8 (H) 06/13/2011 0424   RBC 3.17 (L) 06/13/2011 0424   HGB 10.1 (L) 06/13/2011 0424   HCT 28.9 (L) 06/13/2011 0424   PLT 185 06/13/2011 0424   MCV 91.2 06/13/2011 0424   MCH 31.9 06/13/2011 0424   MCHC 34.9 06/13/2011 0424   RDW 13.0 06/13/2011 0424     Chest imaging:  PFT:  Spirometry 10/02/2015 FEV1  1.28 (65%) ratio 65   Labs:  Path:  Echo:  Heart Catheterization:       Assessment & Plan:   COPD GOLD II   Dyspnea, unspecified type  Allergic rhinitis due to pollen, unspecified seasonality  Discussion: In general this is been a stable interval for Trudy.  One exacerbation of year does not bother me too much, though I would like to be informed whenever she has these so I have asked her to give me a call if she has chest congestion, mucus production, or shortness of breath.  She has a cough which I think is likely related to reflux.  Given the fact that she is a former smoker, she is 78 years old and she has had symptoms of dysphasia from time to time it would be a good idea for her to stay in contact with a gastroenterologist as she is at increased risk for esophageal malignancy.  Plan: COPD: Please let me know if you ever have chest  congestion, mucus production or shortness of breath Keep taking Anoro 1 puff daily no matter how you feel Practice good hand hygiene Stay active I am glad that your flu shot is up to date  Gastroesophageal reflux disease: Given your history of prior smoking and symptoms of trouble swallowing which you have had in the past then I think it is a good idea for you to continue seeing a gastroenterologist  We will see you back in 6 months or sooner if needed   Current Outpatient Medications:  .  albuterol (PROAIR HFA) 108 (90 Base) MCG/ACT inhaler, Inhale 2 puffs into the lungs every 6 (six) hours as needed for wheezing or shortness of breath., Disp: , Rfl:  .  ergocalciferol (VITAMIN D2) 50000 units capsule, Take 50,000 Units by mouth as directed., Disp: , Rfl:  .  fluticasone (FLONASE) 50 MCG/ACT nasal spray, Place 2 sprays into both nostrils daily as needed for allergies or rhinitis., Disp: , Rfl:  .  Multiple Vitamin (MULTIVITAMIN) capsule, Take 1 capsule by mouth daily., Disp: , Rfl:  .  omeprazole (PRILOSEC) 20 MG capsule, Take 20 mg by mouth daily., Disp: , Rfl:  .  raloxifene (EVISTA) 60 MG tablet, Take 60 mg by mouth daily., Disp: , Rfl:  .  umeclidinium-vilanterol (ANORO ELLIPTA) 62.5-25 MCG/INH AEPB, Inhale 1 puff into the lungs daily., Disp: 3 each, Rfl: 3

## 2018-10-07 DIAGNOSIS — R1314 Dysphagia, pharyngoesophageal phase: Secondary | ICD-10-CM | POA: Diagnosis not present

## 2018-11-19 DIAGNOSIS — Q399 Congenital malformation of esophagus, unspecified: Secondary | ICD-10-CM | POA: Diagnosis not present

## 2018-11-19 DIAGNOSIS — K2930 Chronic superficial gastritis without bleeding: Secondary | ICD-10-CM | POA: Diagnosis not present

## 2018-11-19 DIAGNOSIS — K228 Other specified diseases of esophagus: Secondary | ICD-10-CM | POA: Diagnosis not present

## 2018-11-19 DIAGNOSIS — R1310 Dysphagia, unspecified: Secondary | ICD-10-CM | POA: Diagnosis not present

## 2018-11-24 DIAGNOSIS — D1801 Hemangioma of skin and subcutaneous tissue: Secondary | ICD-10-CM | POA: Diagnosis not present

## 2018-11-24 DIAGNOSIS — L821 Other seborrheic keratosis: Secondary | ICD-10-CM | POA: Diagnosis not present

## 2018-11-24 DIAGNOSIS — L720 Epidermal cyst: Secondary | ICD-10-CM | POA: Diagnosis not present

## 2018-11-25 DIAGNOSIS — K228 Other specified diseases of esophagus: Secondary | ICD-10-CM | POA: Diagnosis not present

## 2018-11-25 DIAGNOSIS — K2930 Chronic superficial gastritis without bleeding: Secondary | ICD-10-CM | POA: Diagnosis not present

## 2019-02-28 ENCOUNTER — Other Ambulatory Visit: Payer: Self-pay | Admitting: Pulmonary Disease

## 2019-05-07 DIAGNOSIS — Z1389 Encounter for screening for other disorder: Secondary | ICD-10-CM | POA: Diagnosis not present

## 2019-05-07 DIAGNOSIS — E7800 Pure hypercholesterolemia, unspecified: Secondary | ICD-10-CM | POA: Diagnosis not present

## 2019-05-07 DIAGNOSIS — J449 Chronic obstructive pulmonary disease, unspecified: Secondary | ICD-10-CM | POA: Diagnosis not present

## 2019-05-07 DIAGNOSIS — K219 Gastro-esophageal reflux disease without esophagitis: Secondary | ICD-10-CM | POA: Diagnosis not present

## 2019-05-07 DIAGNOSIS — Z0000 Encounter for general adult medical examination without abnormal findings: Secondary | ICD-10-CM | POA: Diagnosis not present

## 2019-05-13 DIAGNOSIS — J449 Chronic obstructive pulmonary disease, unspecified: Secondary | ICD-10-CM | POA: Diagnosis not present

## 2019-05-13 DIAGNOSIS — M8580 Other specified disorders of bone density and structure, unspecified site: Secondary | ICD-10-CM | POA: Diagnosis not present

## 2019-05-17 DIAGNOSIS — Z23 Encounter for immunization: Secondary | ICD-10-CM | POA: Diagnosis not present

## 2019-06-01 ENCOUNTER — Other Ambulatory Visit: Payer: Self-pay | Admitting: Pulmonary Disease

## 2019-06-02 ENCOUNTER — Other Ambulatory Visit: Payer: Self-pay

## 2019-06-02 DIAGNOSIS — Z20822 Contact with and (suspected) exposure to covid-19: Secondary | ICD-10-CM

## 2019-06-03 LAB — NOVEL CORONAVIRUS, NAA: SARS-CoV-2, NAA: NOT DETECTED

## 2019-06-04 ENCOUNTER — Telehealth: Payer: Self-pay | Admitting: General Practice

## 2019-06-04 NOTE — Telephone Encounter (Signed)
Negative COVID results given. Patient results "NOT Detected." Caller expressed understanding. ° °

## 2019-06-15 DIAGNOSIS — M8580 Other specified disorders of bone density and structure, unspecified site: Secondary | ICD-10-CM | POA: Diagnosis not present

## 2019-06-15 DIAGNOSIS — J449 Chronic obstructive pulmonary disease, unspecified: Secondary | ICD-10-CM | POA: Diagnosis not present

## 2019-06-22 ENCOUNTER — Ambulatory Visit (INDEPENDENT_AMBULATORY_CARE_PROVIDER_SITE_OTHER): Payer: Medicare Other

## 2019-06-22 ENCOUNTER — Encounter: Payer: Self-pay | Admitting: Pulmonary Disease

## 2019-06-22 ENCOUNTER — Other Ambulatory Visit: Payer: Self-pay

## 2019-06-22 ENCOUNTER — Ambulatory Visit (INDEPENDENT_AMBULATORY_CARE_PROVIDER_SITE_OTHER): Payer: Medicare Other | Admitting: Pulmonary Disease

## 2019-06-22 VITALS — BP 114/64 | HR 66 | Temp 97.9°F | Ht 63.0 in | Wt 146.0 lb

## 2019-06-22 DIAGNOSIS — J449 Chronic obstructive pulmonary disease, unspecified: Secondary | ICD-10-CM

## 2019-06-22 MED ORDER — ANORO ELLIPTA 62.5-25 MCG/INH IN AEPB: INHALATION_SPRAY | RESPIRATORY_TRACT | 0 refills | Status: DC

## 2019-06-22 MED ORDER — ANORO ELLIPTA 62.5-25 MCG/INH IN AEPB: 1.0000 | INHALATION_SPRAY | Freq: Every day | RESPIRATORY_TRACT | 0 refills | Status: DC

## 2019-06-22 NOTE — Progress Notes (Signed)
[name]    [id number]    January 07, 1941  Primary Care Physician:Stoneking, Christiane Ha, MD  Referring Physician: Lajean Manes, MD [address] [place],  [place] [address]  Chief complaint:  Patient being followed up for chronic obstructive pulmonary disease  HPI:  Patient with a history of COPD Compliant with use of Anoro Rarely uses rescue inhalers  Exercises regularly  Does not feel she is limited with activities of daily living  She also has reflux-well-controlled at present  No fevers, no chills, no recent exacerbation No recent ED visits   Outpatient Encounter Medications as of 06/22/2019  Medication Sig  . albuterol (PROAIR HFA) 108 (90 Base) MCG/ACT inhaler Inhale 2 puffs into the lungs every 6 (six) hours as needed for wheezing or shortness of breath.  Jearl Klinefelter ELLIPTA 62.5-25 MCG/INH AEPB USE 1 INHALATION DAILY  . ergocalciferol (VITAMIN D2) 50000 units capsule Take 50,000 Units by mouth as directed.  . fluticasone (FLONASE) 50 MCG/ACT nasal spray Place 2 sprays into both nostrils daily as needed for allergies or rhinitis.  . Multiple Vitamin (MULTIVITAMIN) capsule Take 1 capsule by mouth daily.  Marland Kitchen omeprazole (PRILOSEC) 20 MG capsule Take 20 mg by mouth daily.  . raloxifene (EVISTA) 60 MG tablet Take 60 mg by mouth daily.   No facility-administered encounter medications on file as of 06/22/2019.     Allergies as of 06/22/2019  . (No Known Allergies)    Past Medical History:  Diagnosis Date  . Arthritis   . COPD (chronic obstructive pulmonary disease) ([place])   . GERD (gastroesophageal reflux disease)   . Shortness of breath dyspnea    occassional with the COPD    Past Surgical History:  Procedure Laterality Date  . ABDOMINAL HYSTERECTOMY    . COLONOSCOPY WITH PROPOFOL N/A 04/22/2016   Procedure: COLONOSCOPY WITH PROPOFOL;  Surgeon: Garlan Fair, MD;  Location: [facility];  Service: Endoscopy;  Laterality: N/A;  .  ESOPHAGOGASTRODUODENOSCOPY (EGD) WITH PROPOFOL N/A 04/22/2016   Procedure: ESOPHAGOGASTRODUODENOSCOPY (EGD) WITH PROPOFOL;  Surgeon: Garlan Fair, MD;  [facility];  Service: Endoscopy;  Laterality: N/A;  . TOTAL KNEE ARTHROPLASTY Bilateral     History reviewed. No pertinent family history.  Social History   Socioeconomic History  . Marital status: Married    Spouse name: Not on file  . Number of children: Not on file  . Years of education: Not on file  . Highest education level: Not on file  Occupational History  . Occupation: Retired  Scientific laboratory technician  . Financial resource strain: Not on file  . Food insecurity    Worry: Not on file    Inability: Not on file  . Transportation needs    Medical: Not on file    Non-medical: Not on file  Tobacco Use  . Smoking status: Former Smoker    Packs/day: 1.00    Years: 27.00    Pack years: 27.00    Types: Cigarettes    Quit date: 09/17/1983    Years since quitting: 35.7  . Smokeless tobacco: Never Used  Substance and Sexual Activity  . Alcohol use: Yes    Alcohol/week: 7.0 - 14.0 standard drinks    Types: 7 - 14 Glasses of wine per week  . Drug use: No  . Sexual activity: Not on file  Lifestyle  . Physical activity    Days per week: Not on file    Minutes per session: Not  on file  . Stress: Not on file  Relationships  . Social Herbalist on phone: Not on file    Gets together: Not on file    Attends religious service: Not on file    Active member of club or organization: Not on file    Attends meetings of clubs or organizations: Not on file    Relationship status: Not on file  . Intimate partner violence    Fear of current or ex partner: Not on file    Emotionally abused: Not on file    Physically abused: Not on file    Forced sexual activity: Not on file  Other Topics Concern  . Not on file  Social History Narrative  . Not on file    Review of Systems  Constitutional: Negative.   HENT:  Negative.   Eyes: Negative.   Respiratory: Negative.  Negative for apnea, cough and shortness of breath.   Cardiovascular: Negative.   Gastrointestinal: Negative.   Endocrine: Negative.   All other systems reviewed and are negative.   Vitals:   06/22/19 1343  BP: 114/64  Pulse: 66  Temp: 97.9 F (36.6 C)  SpO2: 99%     Physical Exam  Constitutional: She is oriented to person, place, and time. She appears well-developed and well-nourished.  HENT:  Head: Normocephalic.  Eyes: Pupils are equal, round, and reactive to light. Conjunctivae are normal. Right eye exhibits no discharge.  Neck: Normal range of motion. Neck supple. No tracheal deviation present. No thyromegaly present.  Cardiovascular: Normal rate and regular rhythm.  Pulmonary/Chest: Effort normal and breath sounds normal. No respiratory distress. She has no wheezes. She has no rales.  Abdominal: Soft. Bowel sounds are normal. She exhibits no distension. There is no abdominal tenderness. There is no rebound.  Musculoskeletal: Normal range of motion.        General: No edema.  Neurological: She is alert and oriented to person, place, and time. No cranial nerve deficit. Coordination normal.  Skin: Skin is warm and dry. No rash noted. No erythema.  Psychiatric: She has a normal mood and affect.     Data Reviewed: Chart reviewed  Assessment:  COPD -Stable symptoms on Anoro -Rarely uses rescue inhaler needs  GERD -Symptoms well controlled at present   Plan/Recommendations: Continue Anoro  Continue rescue inhaler use as needed  Encourage regular exercises  Obtain chest x-ray  We will follow-up in 6 months Encouraged to call with any significant concerns   Sherrilyn Rist MD [facility] 06/22/2019, 2:03 PM  CC: Lajean Manes, MD

## 2019-06-22 NOTE — Patient Instructions (Signed)
COPD  -Stable  Continue Anoro We will get a chest x-ray  I will see back in the office in about 6 months  Call with significant concerns

## 2019-06-25 ENCOUNTER — Telehealth: Payer: Self-pay | Admitting: Pulmonary Disease

## 2019-06-25 NOTE — Telephone Encounter (Signed)
Call returned to patient, requesting results of CXR.   TP please advise. Thanks.

## 2019-06-25 NOTE — Telephone Encounter (Signed)
CXR results  No acute abnormalities. Chronic bronchitic changes. 2. Tiny area of scarring or atelectasis at the left base.  Dr. Hermina Staggers will discuss in detail with patient or call once he has reviewed   Please contact office for sooner follow up if symptoms do not improve or worsen or seek emergency care

## 2019-06-27 ENCOUNTER — Telehealth: Payer: Self-pay | Admitting: Pulmonary Disease

## 2019-06-27 NOTE — Telephone Encounter (Signed)
Notify patient  Chest x-ray reviewed Mild area of scarring at the base of the lung--not concerning  Continue current plans

## 2019-06-28 NOTE — Telephone Encounter (Signed)
Called and spoke to patient. Relayed results per Rexene Edison, NP. Patient's MyChart code was sent via email to her. She would like to be able to see results in MyChart for the future. Will leave message open to follow up with patient about MyChart.

## 2019-06-28 NOTE — Telephone Encounter (Signed)
LMTCB

## 2019-06-28 NOTE — Telephone Encounter (Signed)
Pt returning call.  [phone].

## 2019-06-29 NOTE — Telephone Encounter (Signed)
Per pt's chart it indicates she is now MyChart active. Will sign off.

## 2019-07-06 NOTE — Telephone Encounter (Signed)
Called and spoke with Patient.  Dr. Olalere's results and recommendations given.  Understanding stated.  Nothing further at this time. 

## 2019-07-08 DIAGNOSIS — M8580 Other specified disorders of bone density and structure, unspecified site: Secondary | ICD-10-CM | POA: Diagnosis not present

## 2019-07-08 DIAGNOSIS — J449 Chronic obstructive pulmonary disease, unspecified: Secondary | ICD-10-CM | POA: Diagnosis not present

## 2019-07-26 DIAGNOSIS — H01005 Unspecified blepharitis left lower eyelid: Secondary | ICD-10-CM | POA: Diagnosis not present

## 2019-07-26 DIAGNOSIS — H52203 Unspecified astigmatism, bilateral: Secondary | ICD-10-CM | POA: Diagnosis not present

## 2019-07-26 DIAGNOSIS — Z961 Presence of intraocular lens: Secondary | ICD-10-CM | POA: Diagnosis not present

## 2019-08-04 ENCOUNTER — Other Ambulatory Visit: Payer: Self-pay

## 2019-08-04 DIAGNOSIS — Z20822 Contact with and (suspected) exposure to covid-19: Secondary | ICD-10-CM

## 2019-08-04 DIAGNOSIS — Z20828 Contact with and (suspected) exposure to other viral communicable diseases: Secondary | ICD-10-CM | POA: Diagnosis not present

## 2019-08-05 LAB — NOVEL CORONAVIRUS, NAA: SARS-CoV-2, NAA: NOT DETECTED

## 2019-09-13 DIAGNOSIS — J449 Chronic obstructive pulmonary disease, unspecified: Secondary | ICD-10-CM | POA: Diagnosis not present

## 2019-09-13 DIAGNOSIS — M8580 Other specified disorders of bone density and structure, unspecified site: Secondary | ICD-10-CM | POA: Diagnosis not present

## 2019-09-30 DIAGNOSIS — Z23 Encounter for immunization: Secondary | ICD-10-CM | POA: Diagnosis not present

## 2019-10-08 DIAGNOSIS — J449 Chronic obstructive pulmonary disease, unspecified: Secondary | ICD-10-CM | POA: Diagnosis not present

## 2019-10-08 DIAGNOSIS — M8580 Other specified disorders of bone density and structure, unspecified site: Secondary | ICD-10-CM | POA: Diagnosis not present

## 2019-10-27 DIAGNOSIS — J449 Chronic obstructive pulmonary disease, unspecified: Secondary | ICD-10-CM | POA: Diagnosis not present

## 2019-10-27 DIAGNOSIS — Z23 Encounter for immunization: Secondary | ICD-10-CM | POA: Diagnosis not present

## 2019-10-27 DIAGNOSIS — M8580 Other specified disorders of bone density and structure, unspecified site: Secondary | ICD-10-CM | POA: Diagnosis not present

## 2019-11-01 DIAGNOSIS — J449 Chronic obstructive pulmonary disease, unspecified: Secondary | ICD-10-CM | POA: Diagnosis not present

## 2019-12-23 ENCOUNTER — Other Ambulatory Visit: Payer: Self-pay | Admitting: Pulmonary Disease

## 2019-12-23 DIAGNOSIS — J449 Chronic obstructive pulmonary disease, unspecified: Secondary | ICD-10-CM

## 2020-01-08 DIAGNOSIS — M8580 Other specified disorders of bone density and structure, unspecified site: Secondary | ICD-10-CM | POA: Diagnosis not present

## 2020-01-08 DIAGNOSIS — J449 Chronic obstructive pulmonary disease, unspecified: Secondary | ICD-10-CM | POA: Diagnosis not present

## 2020-01-25 DIAGNOSIS — L821 Other seborrheic keratosis: Secondary | ICD-10-CM | POA: Diagnosis not present

## 2020-01-25 DIAGNOSIS — L812 Freckles: Secondary | ICD-10-CM | POA: Diagnosis not present

## 2020-03-23 IMAGING — DX DG CHEST 2V
2 series · 2 of 2 positions shown · non-contrast
Comparison: September 02, 2015

CLINICAL DATA: Shortness of breath with cough and wheezing

EXAM:
CHEST - 2 VIEW

[chest pa]
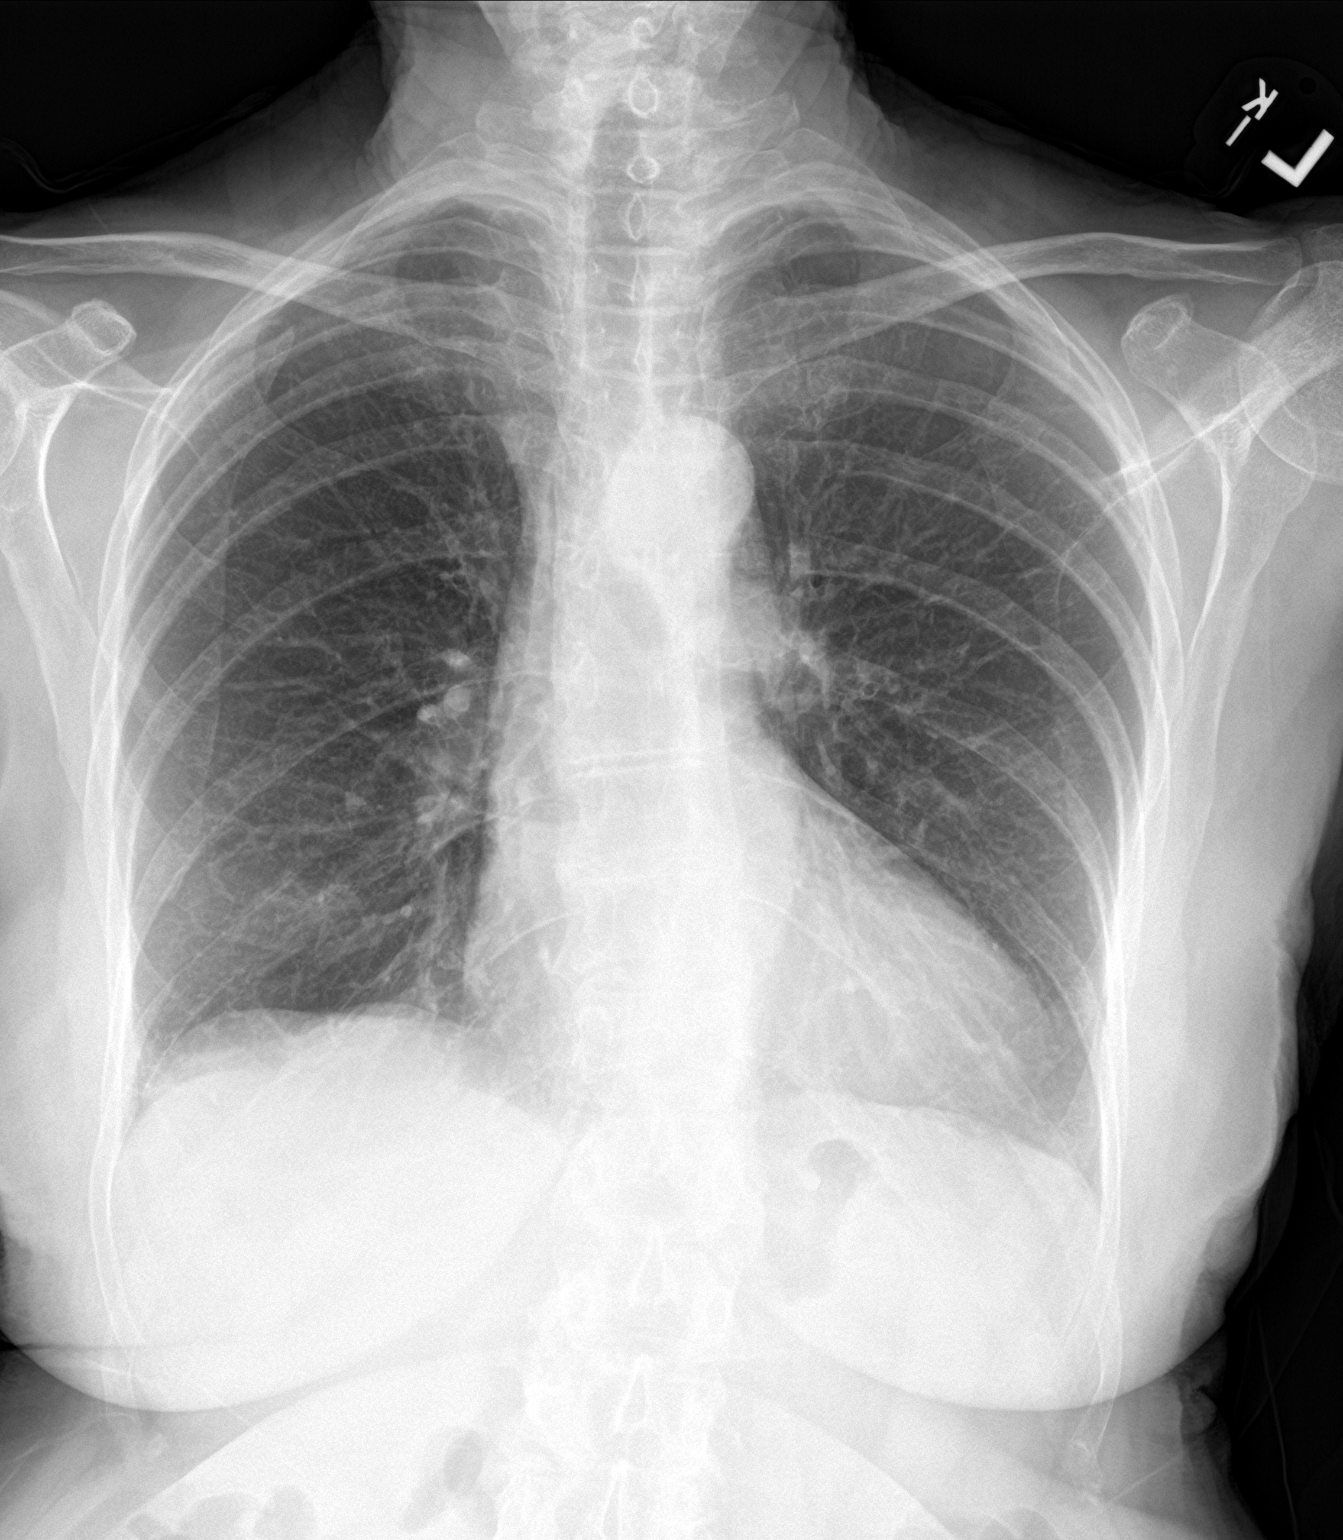

[chest lat]
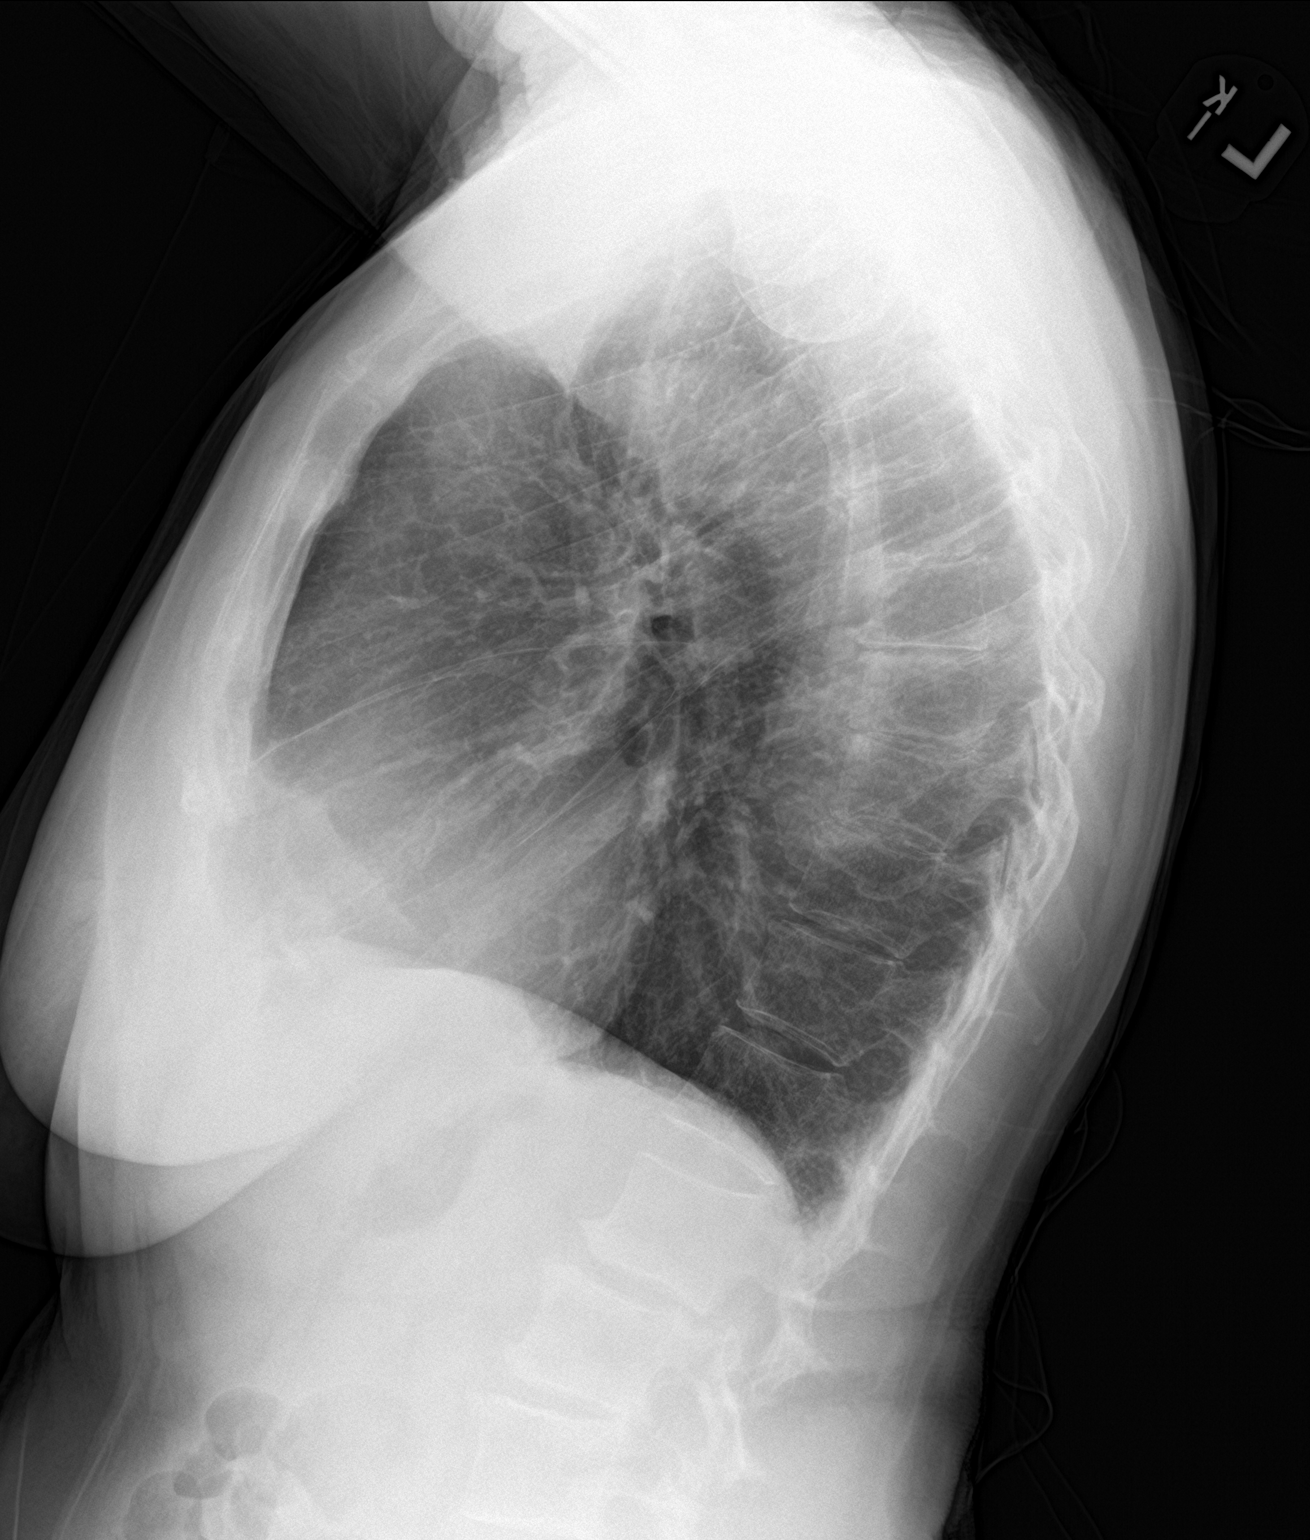

[2 of 2 positions shown; findings below may reference images not displayed]

FINDINGS: There is slight scarring in the right base. Lungs elsewhere are
clear. The heart size and pulmonary vascularity are normal. No
adenopathy. There is mild degenerative change in the thoracic spine.
IMPRESSION: Mild scarring right base. No edema or consolidation. Stable cardiac
silhouette.

## 2020-05-09 DIAGNOSIS — K9089 Other intestinal malabsorption: Secondary | ICD-10-CM | POA: Diagnosis not present

## 2020-05-09 DIAGNOSIS — K219 Gastro-esophageal reflux disease without esophagitis: Secondary | ICD-10-CM | POA: Diagnosis not present

## 2020-05-09 DIAGNOSIS — Z0000 Encounter for general adult medical examination without abnormal findings: Secondary | ICD-10-CM | POA: Diagnosis not present

## 2020-05-09 DIAGNOSIS — J449 Chronic obstructive pulmonary disease, unspecified: Secondary | ICD-10-CM | POA: Diagnosis not present

## 2020-05-09 DIAGNOSIS — Z79899 Other long term (current) drug therapy: Secondary | ICD-10-CM | POA: Diagnosis not present

## 2020-05-09 DIAGNOSIS — Z1389 Encounter for screening for other disorder: Secondary | ICD-10-CM | POA: Diagnosis not present

## 2020-05-09 DIAGNOSIS — M8580 Other specified disorders of bone density and structure, unspecified site: Secondary | ICD-10-CM | POA: Diagnosis not present

## 2020-05-20 DIAGNOSIS — Z23 Encounter for immunization: Secondary | ICD-10-CM | POA: Diagnosis not present

## 2020-06-15 DIAGNOSIS — J449 Chronic obstructive pulmonary disease, unspecified: Secondary | ICD-10-CM | POA: Diagnosis not present

## 2020-06-15 DIAGNOSIS — M8580 Other specified disorders of bone density and structure, unspecified site: Secondary | ICD-10-CM | POA: Diagnosis not present

## 2020-08-21 DIAGNOSIS — Z961 Presence of intraocular lens: Secondary | ICD-10-CM | POA: Diagnosis not present

## 2020-08-21 DIAGNOSIS — H52203 Unspecified astigmatism, bilateral: Secondary | ICD-10-CM | POA: Diagnosis not present

## 2021-01-08 ENCOUNTER — Other Ambulatory Visit: Payer: Self-pay | Admitting: Pulmonary Disease

## 2021-01-08 DIAGNOSIS — J449 Chronic obstructive pulmonary disease, unspecified: Secondary | ICD-10-CM

## 2021-01-24 ENCOUNTER — Ambulatory Visit (INDEPENDENT_AMBULATORY_CARE_PROVIDER_SITE_OTHER): Payer: Medicare Other

## 2021-01-24 ENCOUNTER — Encounter: Payer: Self-pay | Admitting: Pulmonary Disease

## 2021-01-24 ENCOUNTER — Other Ambulatory Visit: Payer: Self-pay

## 2021-01-24 ENCOUNTER — Ambulatory Visit (INDEPENDENT_AMBULATORY_CARE_PROVIDER_SITE_OTHER): Payer: Medicare Other | Admitting: Pulmonary Disease

## 2021-01-24 VITALS — BP 112/70 | HR 72 | Temp 97.7°F | Ht 63.0 in | Wt 146.8 lb

## 2021-01-24 DIAGNOSIS — J449 Chronic obstructive pulmonary disease, unspecified: Secondary | ICD-10-CM

## 2021-01-24 MED ORDER — TRELEGY ELLIPTA 100-62.5-25 MCG/INH IN AEPB: 1.0000 | INHALATION_SPRAY | Freq: Every day | RESPIRATORY_TRACT | 0 refills | Status: DC

## 2021-01-24 MED ORDER — TRELEGY ELLIPTA 100-62.5-25 MCG/INH IN AEPB: 1.0000 | INHALATION_SPRAY | Freq: Every day | RESPIRATORY_TRACT | 4 refills | Status: DC

## 2021-01-24 NOTE — Progress Notes (Signed)
[name]    [id number]    November 08, 1940  Primary Care Physician:Stoneking, Christiane Ha, MD  Referring Physician: Lajean Manes, MD [address] [place],  [place] [address]  Chief complaint:  Patient being followed up for chronic obstructive pulmonary disease Was last seen about 18 months ago  HPI:  Patient with a history of COPD Compliant with inhalers -Increase secretions -She has noticed some more increasing shortness of breath -She has not been using albuterol on a regular basis  Remains very active and continues to exercise about 5 days a week  Denies any recent exacerbations  Does not feel she is limited with activities of daily living  She also has reflux-well-controlled at present  No fevers, no chills, no recent exacerbation No recent ED visits   Outpatient Encounter Medications as of 01/24/2021  Medication Sig  . Calcium-Magnesium-Vitamin D (CALCIUM MAGNESIUM PO) Take by mouth every other day.  Marland Kitchen dextromethorphan-guaiFENesin (MUCINEX DM) 30-600 MG 12hr tablet Take 1 tablet by mouth 2 (two) times daily as needed for cough.  . ergocalciferol (VITAMIN D2) 50000 units capsule Take 50,000 Units by mouth as directed.  . fluticasone (FLONASE) 50 MCG/ACT nasal spray Place 2 sprays into both nostrils daily as needed for allergies or rhinitis.  . Fluticasone-Umeclidin-Vilant (TRELEGY ELLIPTA) 100-62.5-25 MCG/INH AEPB Inhale 1 puff into the lungs daily.  . Multiple Vitamin (MULTIVITAMIN) capsule Take 1 capsule by mouth daily.  Marland Kitchen omeprazole (PRILOSEC) 20 MG capsule Take 20 mg by mouth daily.  . raloxifene (EVISTA) 60 MG tablet Take 60 mg by mouth daily.  . [DISCONTINUED] ANORO ELLIPTA 62.5-25 MCG/INH AEPB USE 1 INHALATION DAILY  . [DISCONTINUED] umeclidinium-vilanterol (ANORO ELLIPTA) 62.5-25 MCG/INH AEPB Inhale 1 puff into the lungs daily.  Marland Kitchen albuterol (VENTOLIN HFA) 108 (90 Base) MCG/ACT inhaler Inhale 2 puffs into the lungs every 6 (six) hours as  needed for wheezing or shortness of breath. (Patient not taking: Reported on 01/24/2021)   No facility-administered encounter medications on file as of 01/24/2021.    Allergies as of 01/24/2021  . (No Known Allergies)    Past Medical History:  Diagnosis Date  . Arthritis   . COPD (chronic obstructive pulmonary disease) ([place])   . GERD (gastroesophageal reflux disease)   . Shortness of breath dyspnea    occassional with the COPD    Past Surgical History:  Procedure Laterality Date  . ABDOMINAL HYSTERECTOMY    . COLONOSCOPY WITH PROPOFOL N/A 04/22/2016   Procedure: COLONOSCOPY WITH PROPOFOL;  Surgeon: Garlan Fair, MD;  [facility];  Service: Endoscopy;  Laterality: N/A;  . ESOPHAGOGASTRODUODENOSCOPY (EGD) WITH PROPOFOL N/A 04/22/2016   Procedure: ESOPHAGOGASTRODUODENOSCOPY (EGD) WITH PROPOFOL;  Surgeon: Garlan Fair, MD;  [facility];  Service: Endoscopy;  Laterality: N/A;  . TOTAL KNEE ARTHROPLASTY Bilateral     History reviewed. No pertinent family history.  Social History   Socioeconomic History  . Marital status: Married    Spouse name: Not on file  . Number of children: Not on file  . Years of education: Not on file  . Highest education level: Not on file  Occupational History  . Occupation: Retired  Tobacco Use  . Smoking status: Former Smoker    Packs/day: 1.00    Years: 27.00    Pack years: 27.00    Types: Cigarettes    Quit date: 09/17/1983    Years since quitting: 37.3  . Smokeless tobacco: Never Used  Substance and  Sexual Activity  . Alcohol use: Yes    Alcohol/week: 7.0 - 14.0 standard drinks    Types: 7 - 14 Glasses of wine per week  . Drug use: No  . Sexual activity: Not on file  Other Topics Concern  . Not on file  Social History Narrative  . Not on file   Social Determinants of Health   Financial Resource Strain: Not on file  Food Insecurity: Not on file  Transportation Needs: Not on file  Physical Activity: Not on  file  Stress: Not on file  Social Connections: Not on file  Intimate Partner Violence: Not on file    Review of Systems  Constitutional: Negative.   HENT: Negative.   Eyes: Negative.   Respiratory: Negative.  Negative for apnea, cough and shortness of breath.   Cardiovascular: Negative.   Gastrointestinal: Negative.   Endocrine: Negative.   Psychiatric/Behavioral: Negative for sleep disturbance.  All other systems reviewed and are negative.   Vitals:   01/24/21 1011  BP: 112/70  Pulse: 72  Temp: 97.7 F (36.5 C)  SpO2: 95%     Physical Exam Constitutional:      Appearance: She is well-developed.  HENT:     Head: Normocephalic and atraumatic.  Eyes:     General:        Right eye: No discharge.     Conjunctiva/sclera: Conjunctivae normal.     Pupils: Pupils are equal, round, and reactive to light.  Neck:     Thyroid: No thyromegaly.     Trachea: No tracheal deviation.  Cardiovascular:     Rate and Rhythm: Normal rate and regular rhythm.  Pulmonary:     Effort: Pulmonary effort is normal. No respiratory distress.     Breath sounds: Normal breath sounds. No wheezing or rales.  Abdominal:     Palpations: Abdomen is soft.  Musculoskeletal:        General: Normal range of motion.     Cervical back: No rigidity or tenderness.  Skin:    General: Skin is warm and dry.     Findings: No erythema or rash.  Neurological:     Mental Status: She is alert and oriented to person, place, and time.     Cranial Nerves: No cranial nerve deficit.     Coordination: Coordination normal.  Psychiatric:        Mood and Affect: Mood normal.    Data Reviewed: Chart reviewed Previous chest x-ray reviewed Previous PFT reviewed  Assessment:  COPD -Slight increase in symptoms -Rarely uses rescue inhaler needs -We did discuss escalating inhalers -We agreed on starting Trelegy -Samples provided  GERD -Symptoms well controlled at present   Plan/Recommendations: Switch to  Trelegy  Rescue inhaler use as needed  Continue with regular exercises  Obtain chest x-ray Obtain pulmonary function test against next visit   Follow-up in a year -Encouraged to call with any significant concerns   Sherrilyn Rist MD [facility] 01/24/2021, 10:27 AM  CC: Lajean Manes, MD

## 2021-01-24 NOTE — Patient Instructions (Signed)
Chest x-ray today for COPD  Pulmonary function test at next visit in a year  Will replace new Anoro with Trelegy We will provide you with samples  Call us to let us know if its not working well for you  Call with significant concerns  Follow-up a year from now

## 2021-01-25 DIAGNOSIS — L570 Actinic keratosis: Secondary | ICD-10-CM | POA: Diagnosis not present

## 2021-01-25 DIAGNOSIS — L821 Other seborrheic keratosis: Secondary | ICD-10-CM | POA: Diagnosis not present

## 2021-01-25 DIAGNOSIS — L738 Other specified follicular disorders: Secondary | ICD-10-CM | POA: Diagnosis not present

## 2021-02-13 ENCOUNTER — Telehealth: Payer: Self-pay | Admitting: Pulmonary Disease

## 2021-02-13 MED ORDER — TRELEGY ELLIPTA 100-62.5-25 MCG/INH IN AEPB: 1.0000 | INHALATION_SPRAY | Freq: Every day | RESPIRATORY_TRACT | 0 refills | Status: DC

## 2021-02-13 NOTE — Telephone Encounter (Signed)
I have sent the medication to the pharmacy for the pt.  I have called and LM on VM to make her aware.

## 2021-03-13 ENCOUNTER — Other Ambulatory Visit (HOSPITAL_BASED_OUTPATIENT_CLINIC_OR_DEPARTMENT_OTHER): Payer: Self-pay

## 2021-03-13 MED ORDER — AMOXICILLIN 500 MG PO CAPS
ORAL_CAPSULE | ORAL | 1 refills | Status: AC
  Filled 2021-03-13: qty 21, 7d supply, fill #0
  Filled 2021-07-12: qty 21, 7d supply, fill #1

## 2021-05-18 DIAGNOSIS — M8580 Other specified disorders of bone density and structure, unspecified site: Secondary | ICD-10-CM | POA: Diagnosis not present

## 2021-05-18 DIAGNOSIS — K5901 Slow transit constipation: Secondary | ICD-10-CM | POA: Diagnosis not present

## 2021-05-18 DIAGNOSIS — Z23 Encounter for immunization: Secondary | ICD-10-CM | POA: Diagnosis not present

## 2021-05-18 DIAGNOSIS — M8588 Other specified disorders of bone density and structure, other site: Secondary | ICD-10-CM | POA: Diagnosis not present

## 2021-05-18 DIAGNOSIS — Z1389 Encounter for screening for other disorder: Secondary | ICD-10-CM | POA: Diagnosis not present

## 2021-05-18 DIAGNOSIS — R252 Cramp and spasm: Secondary | ICD-10-CM | POA: Diagnosis not present

## 2021-05-18 DIAGNOSIS — Z79899 Other long term (current) drug therapy: Secondary | ICD-10-CM | POA: Diagnosis not present

## 2021-05-18 DIAGNOSIS — J449 Chronic obstructive pulmonary disease, unspecified: Secondary | ICD-10-CM | POA: Diagnosis not present

## 2021-05-18 DIAGNOSIS — Z0000 Encounter for general adult medical examination without abnormal findings: Secondary | ICD-10-CM | POA: Diagnosis not present

## 2021-05-18 DIAGNOSIS — K219 Gastro-esophageal reflux disease without esophagitis: Secondary | ICD-10-CM | POA: Diagnosis not present

## 2021-05-24 DIAGNOSIS — M85852 Other specified disorders of bone density and structure, left thigh: Secondary | ICD-10-CM | POA: Diagnosis not present

## 2021-05-24 DIAGNOSIS — M85851 Other specified disorders of bone density and structure, right thigh: Secondary | ICD-10-CM | POA: Diagnosis not present

## 2021-05-24 DIAGNOSIS — Z780 Asymptomatic menopausal state: Secondary | ICD-10-CM | POA: Diagnosis not present

## 2021-05-24 DIAGNOSIS — Z1231 Encounter for screening mammogram for malignant neoplasm of breast: Secondary | ICD-10-CM | POA: Diagnosis not present

## 2021-06-08 ENCOUNTER — Other Ambulatory Visit (HOSPITAL_BASED_OUTPATIENT_CLINIC_OR_DEPARTMENT_OTHER): Payer: Self-pay

## 2021-06-08 DIAGNOSIS — Z780 Asymptomatic menopausal state: Secondary | ICD-10-CM | POA: Diagnosis not present

## 2021-06-08 DIAGNOSIS — M8588 Other specified disorders of bone density and structure, other site: Secondary | ICD-10-CM | POA: Diagnosis not present

## 2021-06-08 MED ORDER — ALENDRONATE SODIUM 70 MG PO TABS
ORAL_TABLET | ORAL | 12 refills | Status: DC
  Filled 2021-06-08: qty 4, 28d supply, fill #0

## 2021-06-11 ENCOUNTER — Other Ambulatory Visit (HOSPITAL_BASED_OUTPATIENT_CLINIC_OR_DEPARTMENT_OTHER): Payer: Self-pay

## 2021-06-21 ENCOUNTER — Other Ambulatory Visit (HOSPITAL_BASED_OUTPATIENT_CLINIC_OR_DEPARTMENT_OTHER): Payer: Self-pay

## 2021-07-09 ENCOUNTER — Other Ambulatory Visit (HOSPITAL_BASED_OUTPATIENT_CLINIC_OR_DEPARTMENT_OTHER): Payer: Self-pay

## 2021-07-12 ENCOUNTER — Other Ambulatory Visit (HOSPITAL_BASED_OUTPATIENT_CLINIC_OR_DEPARTMENT_OTHER): Payer: Self-pay

## 2021-08-06 IMAGING — DX DG CHEST 2V
2 series · 2 of 2 positions shown · non-contrast
Comparison: 02/06/2018

CLINICAL DATA: Dyspnea. COPD.

EXAM:
CHEST - 2 VIEW

[chest pa]
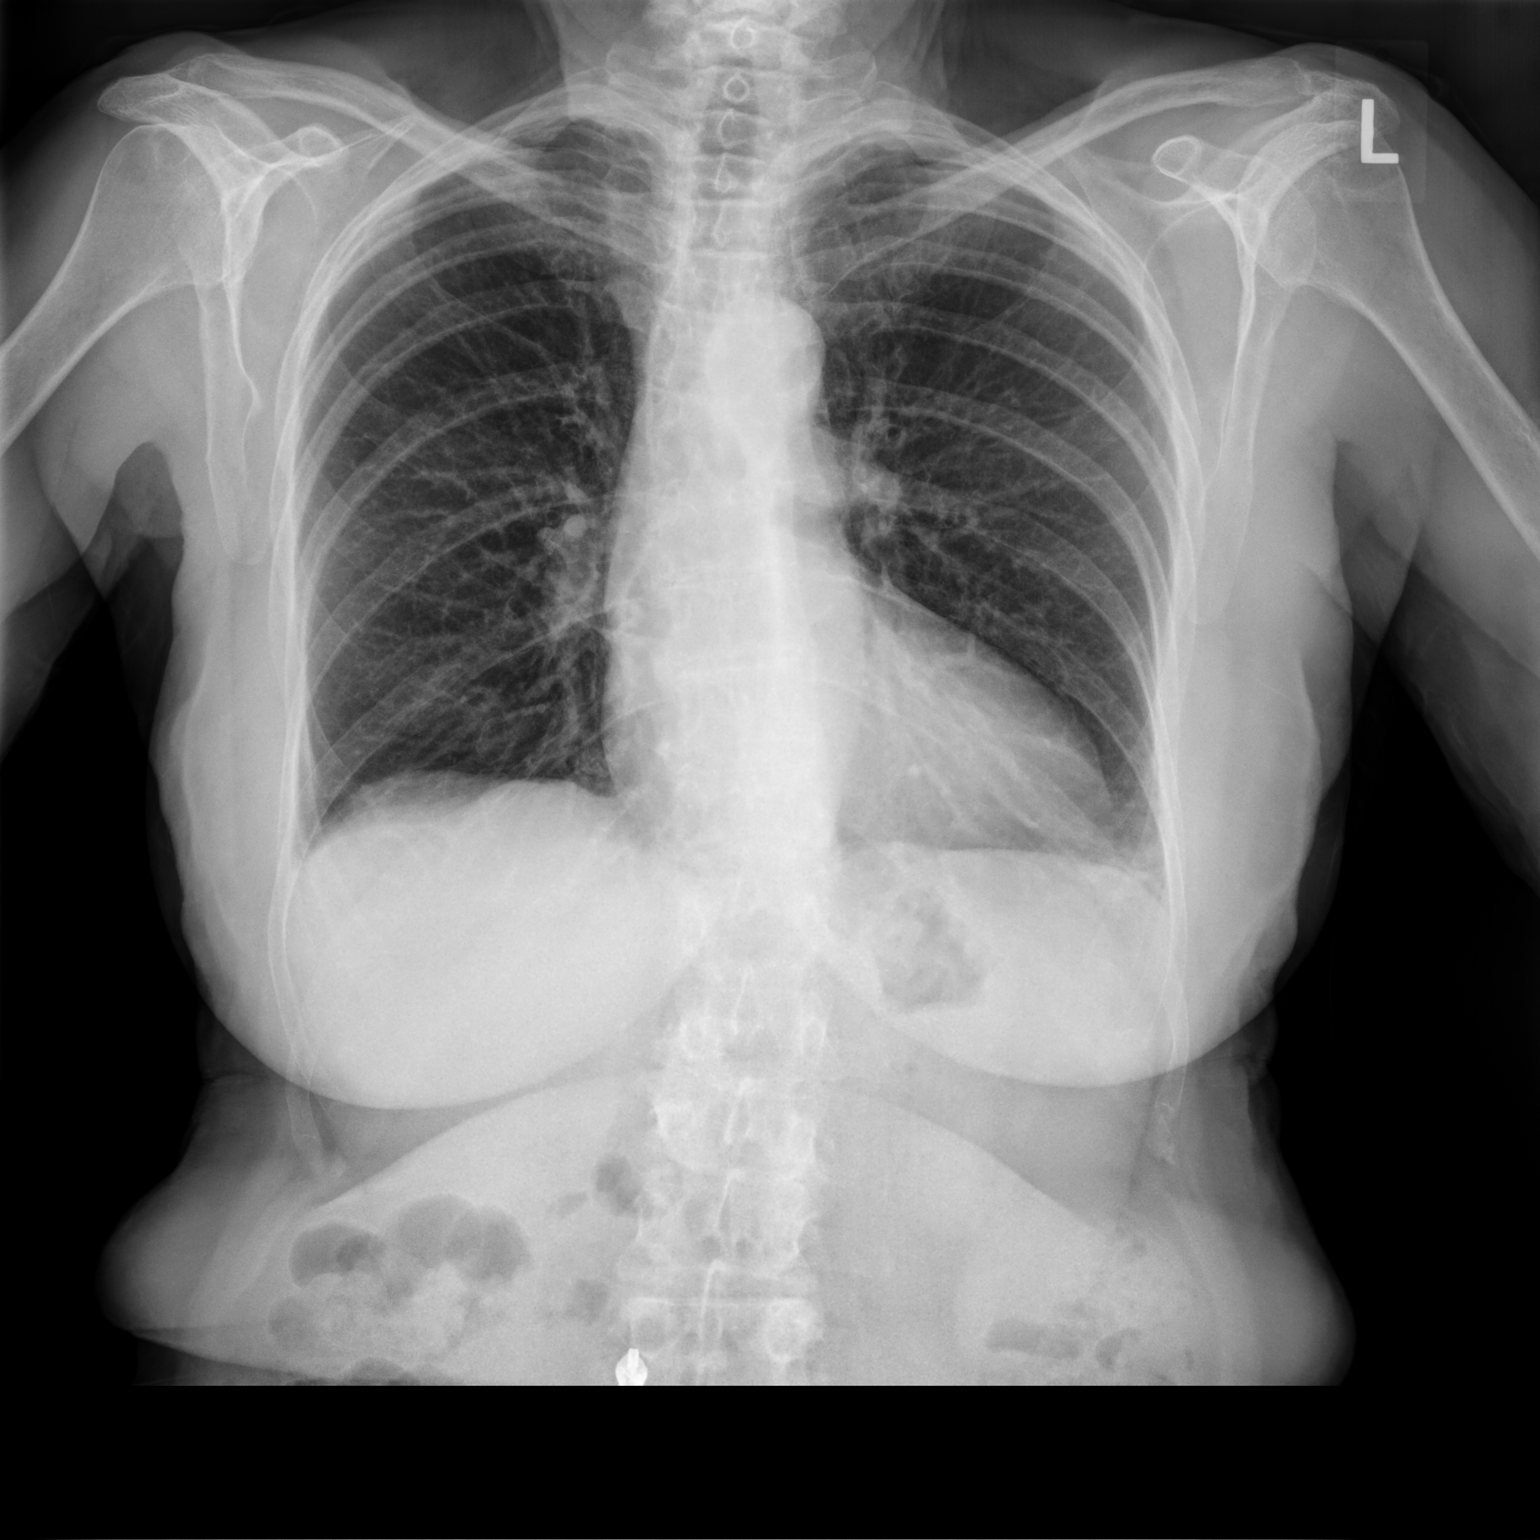

[chest lat]
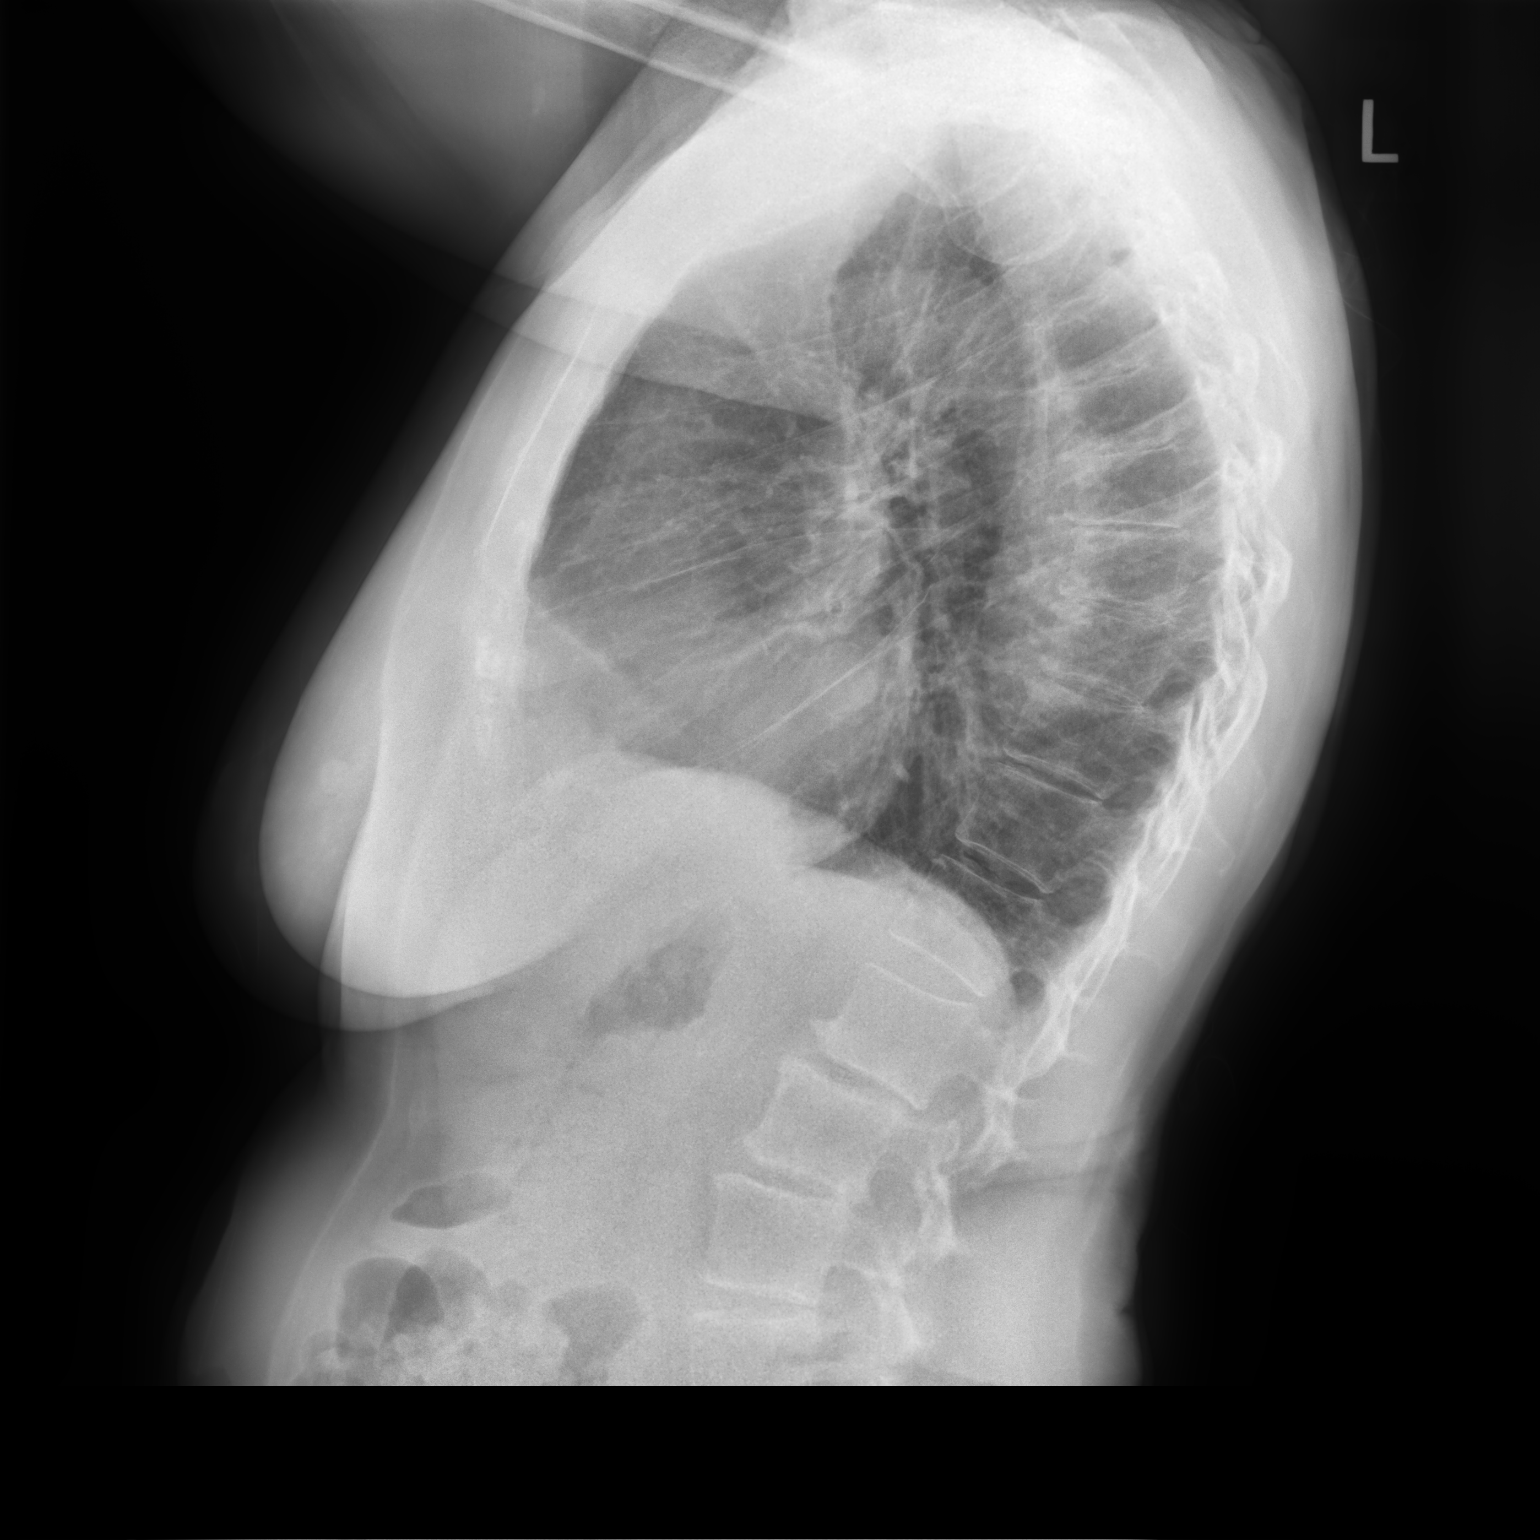

[2 of 2 positions shown; findings below may reference images not displayed]

FINDINGS: The heart size and pulmonary vascularity are normal. Slight chronic
peribronchial thickening. Tiny area of atelectasis or scarring at
the left base laterally. No acute infiltrates or effusions. No acute
bone abnormality.
IMPRESSION: 1. No acute abnormalities. Chronic bronchitic changes.
2. Tiny area of scarring or atelectasis at the left base.

## 2021-09-17 DIAGNOSIS — H04222 Epiphora due to insufficient drainage, left lacrimal gland: Secondary | ICD-10-CM | POA: Diagnosis not present

## 2021-09-17 DIAGNOSIS — Z961 Presence of intraocular lens: Secondary | ICD-10-CM | POA: Diagnosis not present

## 2021-10-22 DIAGNOSIS — L821 Other seborrheic keratosis: Secondary | ICD-10-CM | POA: Diagnosis not present

## 2021-10-22 DIAGNOSIS — L570 Actinic keratosis: Secondary | ICD-10-CM | POA: Diagnosis not present

## 2021-10-22 DIAGNOSIS — L738 Other specified follicular disorders: Secondary | ICD-10-CM | POA: Diagnosis not present

## 2021-10-22 DIAGNOSIS — L820 Inflamed seborrheic keratosis: Secondary | ICD-10-CM | POA: Diagnosis not present

## 2021-10-29 DIAGNOSIS — H04222 Epiphora due to insufficient drainage, left lacrimal gland: Secondary | ICD-10-CM | POA: Diagnosis not present

## 2021-11-23 DIAGNOSIS — K219 Gastro-esophageal reflux disease without esophagitis: Secondary | ICD-10-CM | POA: Diagnosis not present

## 2021-11-23 DIAGNOSIS — M26609 Unspecified temporomandibular joint disorder, unspecified side: Secondary | ICD-10-CM | POA: Diagnosis not present

## 2022-01-21 ENCOUNTER — Ambulatory Visit (INDEPENDENT_AMBULATORY_CARE_PROVIDER_SITE_OTHER): Payer: Medicare Other | Admitting: Pulmonary Disease

## 2022-01-21 ENCOUNTER — Encounter: Payer: Self-pay | Admitting: Pulmonary Disease

## 2022-01-21 VITALS — BP 114/72 | HR 71 | Ht 63.0 in | Wt 146.0 lb

## 2022-01-21 DIAGNOSIS — J449 Chronic obstructive pulmonary disease, unspecified: Secondary | ICD-10-CM

## 2022-01-21 MED ORDER — TRELEGY ELLIPTA 100-62.5-25 MCG/ACT IN AEPB: 1.0000 | INHALATION_SPRAY | Freq: Every day | RESPIRATORY_TRACT | 3 refills | Status: DC

## 2022-01-21 NOTE — Progress Notes (Signed)
Full PFT performed today. °

## 2022-01-21 NOTE — Progress Notes (Signed)
? ?      ?[name]    [id number]    1941/03/03 ? ?Primary Care Physician:Stoneking, Hal, MD ? ?Referring Physician: Lajean Manes, MD ?[address] ?[address] ?[place],  [place] [address] ? ?Chief complaint:  ?Patient being followed up for chronic obstructive pulmonary disease ? ?HPI: ? ?Patient with a history of COPD ?Compliant with inhalers ?- ?-Continues to do well with no limitations in activities ?-Compliant with Trelegy 100 ? ?Exercises regularly ? ?Denies any recent exacerbations ? ?Does not feel she is limited with activities of daily living ? ?She also has reflux-well-controlled at present ? ?No fevers, no chills, no recent exacerbation ?No recent ED visits ? ? ?Outpatient Encounter Medications as of 01/21/2022  ?Medication Sig  ? albuterol (VENTOLIN HFA) 108 (90 Base) MCG/ACT inhaler Inhale 2 puffs into the lungs every 6 (six) hours as needed for wheezing or shortness of breath.  ? amoxicillin (AMOXIL) 500 MG capsule Take 2 capsules now then take 1 capsule three times daily until gone.  ? Calcium-Magnesium-Vitamin D (CALCIUM MAGNESIUM PO) Take by mouth every other day.  ? dextromethorphan-guaiFENesin (MUCINEX DM) 30-600 MG 12hr tablet Take 1 tablet by mouth 2 (two) times daily as needed for cough.  ? ergocalciferol (VITAMIN D2) 50000 units capsule Take 50,000 Units by mouth as directed.  ? fluticasone (FLONASE) 50 MCG/ACT nasal spray Place 2 sprays into both nostrils daily as needed for allergies or rhinitis.  ? Fluticasone-Umeclidin-Vilant (TRELEGY ELLIPTA) 100-62.5-25 MCG/ACT AEPB Inhale 1 puff into the lungs daily.  ? Multiple Vitamin (MULTIVITAMIN) capsule Take 1 capsule by mouth daily.  ? omeprazole (PRILOSEC) 20 MG capsule Take 20 mg by mouth daily.  ? raloxifene (EVISTA) 60 MG tablet Take 60 mg by mouth daily.  ? risedronate (ACTONEL) 35 MG tablet Take 35 mg by mouth once a week.  ? [DISCONTINUED] Fluticasone-Umeclidin-Vilant (TRELEGY ELLIPTA) 100-62.5-25 MCG/INH AEPB Inhale 1 puff into  the lungs daily.  ? [DISCONTINUED] alendronate (FOSAMAX) 70 MG tablet Take 1 tablet by mouth weekly 30 min before the first food/beverage/medication of the day with plain water.  ? [DISCONTINUED] Fluticasone-Umeclidin-Vilant (TRELEGY ELLIPTA) 100-62.5-25 MCG/INH AEPB Inhale 1 puff into the lungs daily.  ? ?No facility-administered encounter medications on file as of 01/21/2022.  ? ? ?Allergies as of 01/21/2022  ? (No Known Allergies)  ? ? ?Past Medical History:  ?Diagnosis Date  ? Arthritis   ? COPD (chronic obstructive pulmonary disease) ([place])   ? GERD (gastroesophageal reflux disease)   ? Shortness of breath dyspnea   ? occassional with the COPD  ? ? ?Past Surgical History:  ?Procedure Laterality Date  ? ABDOMINAL HYSTERECTOMY    ? COLONOSCOPY WITH PROPOFOL N/A 04/22/2016  ? Procedure: COLONOSCOPY WITH PROPOFOL;  Surgeon: Garlan Fair, MD;  [facility];  Service: Endoscopy;  Laterality: N/A;  ? ESOPHAGOGASTRODUODENOSCOPY (EGD) WITH PROPOFOL N/A 04/22/2016  ? Procedure: ESOPHAGOGASTRODUODENOSCOPY (EGD) WITH PROPOFOL;  Surgeon: Garlan Fair, MD;  [facility];  Service: Endoscopy;  Laterality: N/A;  ? TOTAL KNEE ARTHROPLASTY Bilateral   ? ? ?No family history on file. ? ?Social History  ? ?Socioeconomic History  ? Marital status: Married  ?  Spouse name: Not on file  ? Number of children: Not on file  ? Years of education: Not on file  ? Highest education level: Not on file  ?Occupational History  ? Occupation: Retired  ?Tobacco Use  ? Smoking status: Former  ?  Packs/day: 1.00  ?  Years: 27.00  ?  Pack years: 27.00  ?  Types: Cigarettes  ?  Quit date: 09/17/1983  ?  Years since quitting: 38.3  ? Smokeless tobacco: Never  ?Substance and Sexual Activity  ? Alcohol use: Yes  ?  Alcohol/week: 7.0 - 14.0 standard drinks  ?  Types: 7 - 14 Glasses of wine per week  ? Drug use: No  ? Sexual activity: Not on file  ?Other Topics Concern  ? Not on file  ?Social History Narrative  ? Not on file  ? ?Social  Determinants of Health  ? ?Financial Resource Strain: Not on file  ?Food Insecurity: Not on file  ?Transportation Needs: Not on file  ?Physical Activity: Not on file  ?Stress: Not on file  ?Social Connections: Not on file  ?Intimate Partner Violence: Not on file  ? ? ?Review of Systems  ?Constitutional: Negative.   ?HENT: Negative.    ?Eyes: Negative.   ?Respiratory: Negative.  Negative for apnea, cough and shortness of breath.   ?Cardiovascular: Negative.   ?Gastrointestinal: Negative.   ?Endocrine: Negative.   ?Psychiatric/Behavioral:  Negative for sleep disturbance.   ?All other systems reviewed and are negative. ? ?Vitals:  ? 01/21/22 1150  ?BP: 114/72  ?Pulse: 71  ?SpO2: 97%  ? ? ? ?Physical Exam ?Constitutional:   ?   Appearance: Normal appearance. She is well-developed.  ?HENT:  ?   Head: Normocephalic and atraumatic.  ?Neck:  ?   Thyroid: No thyromegaly.  ?   Trachea: No tracheal deviation.  ?Cardiovascular:  ?   Rate and Rhythm: Normal rate and regular rhythm.  ?Pulmonary:  ?   Effort: Pulmonary effort is normal. No respiratory distress.  ?   Breath sounds: Normal breath sounds. No wheezing or rales.  ?Abdominal:  ?   Palpations: Abdomen is soft.  ?Musculoskeletal:     ?   General: Normal range of motion.  ?   Cervical back: No rigidity or tenderness.  ?Skin: ?   General: Skin is warm and dry.  ?   Findings: No erythema or rash.  ?Neurological:  ?   Mental Status: She is alert and oriented to person, place, and time.  ?   Cranial Nerves: No cranial nerve deficit.  ?   Coordination: Coordination normal.  ?Psychiatric:     ?   Mood and Affect: Mood normal.  ? ?Data Reviewed: ?Chart reviewed ?Previous chest x-ray reviewed ?Previous PFT reviewed ? ?PFT today with no obstruction, no significant bronchodilator response, no restriction, normal diffusing capacity-reviewed with patient ? ?Assessment:  ?COPD ?-Compliant with Trelegy ?-Rarely uses albuterol ? ?GERD ?-Symptoms well controlled at  present ? ? ?Plan/Recommendations: ? ?Continue Trelegy ? ?Rescue inhaler as needed ? ?PFT with no obstructive disease noted ? ?I will see her a year from now ? ? ? ?Sherrilyn Rist MD ?[facility] ?01/21/2022, 12:08 PM ? ?CC: Lajean Manes, MD ? ? ?

## 2022-01-21 NOTE — Patient Instructions (Signed)
Full PFT performed today.l ?

## 2022-01-21 NOTE — Patient Instructions (Signed)
PFT as reviewed ? ?Continue Trelegy 100 ? ?I will see you a year from now ? ?Call with significant concerns ?

## 2022-01-30 DIAGNOSIS — L821 Other seborrheic keratosis: Secondary | ICD-10-CM | POA: Diagnosis not present

## 2022-01-30 DIAGNOSIS — I788 Other diseases of capillaries: Secondary | ICD-10-CM | POA: Diagnosis not present

## 2022-02-12 LAB — PULMONARY FUNCTION TEST
DL/VA % pred: 100 %
DL/VA: 4.1 ml/min/mmHg/L
DLCO cor % pred: 75 %
DLCO cor: 13.66 ml/min/mmHg
DLCO unc % pred: 75 %
DLCO unc: 13.66 ml/min/mmHg
FEF 25-75 Post: 0.81 L/sec
FEF 25-75 Pre: 1.04 L/sec
FEF2575-%Change-Post: -21 %
FEF2575-%Pred-Post: 65 %
FEF2575-%Pred-Pre: 82 %
FEV1-%Change-Post: -4 %
FEV1-%Pred-Post: 78 %
FEV1-%Pred-Pre: 81 %
FEV1-Post: 1.39 L
FEV1-Pre: 1.45 L
FEV1FVC-%Change-Post: -3 %
FEV1FVC-%Pred-Pre: 101 %
FEV6-%Change-Post: 0 %
FEV6-%Pred-Post: 85 %
FEV6-%Pred-Pre: 85 %
FEV6-Post: 1.93 L
FEV6-Pre: 1.93 L
FEV6FVC-%Pred-Post: 106 %
FEV6FVC-%Pred-Pre: 106 %
FVC-%Change-Post: 0 %
FVC-%Pred-Post: 80 %
FVC-%Pred-Pre: 80 %
FVC-Post: 1.93 L
FVC-Pre: 1.94 L
Post FEV1/FVC ratio: 72 %
Post FEV6/FVC ratio: 100 %
Pre FEV1/FVC ratio: 75 %
Pre FEV6/FVC Ratio: 100 %
RV % pred: 74 %
RV: 1.78 L
TLC % pred: 98 %
TLC: 4.85 L

## 2022-02-21 ENCOUNTER — Ambulatory Visit: Payer: Medicare Other | Attending: Internal Medicine

## 2022-02-22 ENCOUNTER — Other Ambulatory Visit (HOSPITAL_BASED_OUTPATIENT_CLINIC_OR_DEPARTMENT_OTHER): Payer: Self-pay

## 2022-02-22 MED ORDER — PFIZER COVID-19 VAC BIVALENT 30 MCG/0.3ML IM SUSP
INTRAMUSCULAR | 0 refills | Status: AC
  Filled 2022-02-22: qty 0.3, 1d supply, fill #0

## 2022-02-22 NOTE — Progress Notes (Signed)
   Covid-19 Vaccination Clinic  Name:  [name]    MRN: [id number] DOB: 1941-03-06  02/22/2022  [name] was observed post Covid-19 immunization for 15 minutes without incident. She was provided with Vaccine Information Sheet and instruction to access the V-Safe system.   [name] was instructed to call 911 with any severe reactions post vaccine: Difficulty breathing  Swelling of face and throat  A fast heartbeat  A bad rash all over body  Dizziness and weakness

## 2022-03-31 ENCOUNTER — Encounter (HOSPITAL_COMMUNITY): Payer: Self-pay

## 2022-03-31 ENCOUNTER — Ambulatory Visit (HOSPITAL_COMMUNITY)
Admission: EM | Admit: 2022-03-31 | Discharge: 2022-03-31 | Disposition: A | Discharge status: Home / Self Care | Payer: Medicare Other | Attending: Emergency Medicine | Admitting: Emergency Medicine

## 2022-03-31 DIAGNOSIS — H6122 Impacted cerumen, left ear: Secondary | ICD-10-CM

## 2022-03-31 MED ORDER — DEBROX 6.5 % OT SOLN: 5.0000 [drp] | OTIC | 0 refills | Status: AC

## 2022-03-31 MED ORDER — DEBROX 6.5 % OT SOLN: 5.0000 [drp] | OTIC | 0 refills | Status: DC

## 2022-03-31 NOTE — ED Triage Notes (Signed)
Pt presents to [facility] with pain in l ear. Pt st she attempted to clean ear with q tip yesterday. Pts ear appears to be impacted

## 2022-03-31 NOTE — ED Provider Notes (Signed)
[name]    CSN: [id number] Arrival date & time: 03/31/22  1623     History   Chief Complaint Left ear pain.   HPI [name] is a 81 y.o. female.  Presents with left ear pain.  Reports wax buildup.  Has a history of this for which she has required rinse outs. Tried to clean out with Q-tip but was unable to remove any wax. Denies any drainage from the ear.  No fevers, cough or congestion, sore throat. No recent swimming, diving or flying. Does not wear hearing aids. No pain or pressure in the right ear.  Past Medical History:  Diagnosis Date   Arthritis    COPD (chronic obstructive pulmonary disease) (HCC)    GERD (gastroesophageal reflux disease)    Shortness of breath dyspnea    occassional with the COPD    Patient Active Problem List   Diagnosis Date Noted   COPD GOLD II  10/02/2015   Upper airway cough syndrome 10/02/2015    Past Surgical History:  Procedure Laterality Date   ABDOMINAL HYSTERECTOMY     COLONOSCOPY WITH PROPOFOL N/A 04/22/2016   Procedure: COLONOSCOPY WITH PROPOFOL;  Surgeon: Garlan Fair, MD;  Location: [facility];  Service: Endoscopy;  Laterality: N/A;   ESOPHAGOGASTRODUODENOSCOPY (EGD) WITH PROPOFOL N/A 04/22/2016   Procedure: ESOPHAGOGASTRODUODENOSCOPY (EGD) WITH PROPOFOL;  Surgeon: Garlan Fair, MD;  Location: [facility];  Service: Endoscopy;  Laterality: N/A;   TOTAL KNEE ARTHROPLASTY Bilateral     OB History   No obstetric history on file.      Home Medications    Prior to Admission medications   Medication Sig Start Date End Date Taking? Authorizing Provider  albuterol (VENTOLIN HFA) 108 (90 Base) MCG/ACT inhaler Inhale 2 puffs into the lungs every 6 (six) hours as needed for wheezing or shortness of breath.    [provider]  amoxicillin (AMOXIL) 500 MG capsule Take 2 capsules now then take 1 capsule three times daily until gone. 03/13/21     Calcium-Magnesium-Vitamin D (CALCIUM MAGNESIUM PO)  Take by mouth every other day.    [provider]  carbamide peroxide (DEBROX) 6.5 % OTIC solution Place 5 drops into both ears 2 (two) times a week. 04/01/22   Efren Kross, PA-C  COVID-19 mRNA bivalent vaccine, Pfizer, (PFIZER COVID-19 VAC BIVALENT) injection Inject into the muscle. 02/22/22   Carlyle Basques, MD  dextromethorphan-guaiFENesin [facility] DM) 30-600 MG 12hr tablet Take 1 tablet by mouth 2 (two) times daily as needed for cough.    [provider]  ergocalciferol (VITAMIN D2) 50000 units capsule Take 50,000 Units by mouth as directed.    [provider]  fluticasone (FLONASE) 50 MCG/ACT nasal spray Place 2 sprays into both nostrils daily as needed for allergies or rhinitis.    [provider]  Fluticasone-Umeclidin-Vilant (TRELEGY ELLIPTA) 100-62.5-25 MCG/ACT AEPB Inhale 1 puff into the lungs daily. 01/21/22   Laurin Coder, MD  Multiple Vitamin (MULTIVITAMIN) capsule Take 1 capsule by mouth daily.    [provider]  omeprazole (PRILOSEC) 20 MG capsule Take 20 mg by mouth daily.    [provider]  raloxifene (EVISTA) 60 MG tablet Take 60 mg by mouth daily.    [provider]  risedronate (ACTONEL) 35 MG tablet Take 35 mg by mouth once a week. 12/24/21   [provider]    Family History History reviewed. No pertinent family history.  Social History Social History   Tobacco  Use   Smoking status: Former    Packs/day: 1.00    Years: 27.00    Total pack years: 27.00    Types: Cigarettes    Quit date: 09/17/1983    Years since quitting: 38.5   Smokeless tobacco: Never  Substance Use Topics   Alcohol use: Yes    Alcohol/week: 7.0 - 14.0 standard drinks of alcohol    Types: 7 - 14 Glasses of wine per week   Drug use: No     Allergies   Patient has no known allergies.   Review of Systems Review of Systems  All other systems reviewed and are negative.  Per HPI  Physical Exam Triage Vital  Signs ED Triage Vitals  Enc Vitals Group     BP 03/31/22 1707 (!) 147/81     Pulse Rate 03/31/22 1707 77     Resp 03/31/22 1707 18     Temp 03/31/22 1707 98.6 F (37 C)     Temp Source 03/31/22 1707 Oral     SpO2 03/31/22 1707 98 %     Weight --      Height --      Head Circumference --      Peak Flow --      Pain Score 03/31/22 1706 3     Pain Loc --      Pain Edu? --      Excl. in [place]? --    No data found.  Updated Vital Signs BP (!) 147/81   Pulse 77   Temp 98.6 F (37 C) (Oral)   Resp 18   SpO2 98%    Physical Exam Vitals and nursing note reviewed.  Constitutional:      General: She is not in acute distress.    Comments: Very pleasant 81 year old female, appears younger than stated age  HENT:     Right Ear: Tympanic membrane, ear canal and external ear normal.     Left Ear: External ear normal. There is impacted cerumen.     Mouth/Throat:     Mouth: Mucous membranes are moist.     Pharynx: Oropharynx is clear.  Eyes:     Conjunctiva/sclera: Conjunctivae normal.  Cardiovascular:     Rate and Rhythm: Normal rate and regular rhythm.     Pulses: Normal pulses.     Heart sounds: Normal heart sounds.  Pulmonary:     Effort: Pulmonary effort is normal.     Breath sounds: Normal breath sounds.  Neurological:     Mental Status: She is alert and oriented to person, place, and time.     UC Treatments / Results  Labs (all labs ordered are listed, but only abnormal results are displayed) Labs Reviewed - No data to display  EKG  Radiology No results found.  Procedures Procedures  Medications Ordered in UC Medications - No data to display  Initial Impression / Assessment and Plan / UC Course  I have reviewed the triage vital signs and the nursing notes.  Pertinent labs & imaging results that were available during my care of the patient were reviewed by me and considered in my medical decision making (see chart for details).  Left ear wax  impaction. Rinse out in clinic unable to be completed due to patient discomfort. Recommend debrox drops twice daily for 5 days to soften wax, then use twice weekly. Follow up with primary care. Return precautions discussed. Patient agrees to plan and is discharged in stable condition.  Final  Clinical Impressions(s) / UC Diagnoses   Final diagnoses:  Left ear impacted cerumen     Discharge Instructions      I recommend to use the ear drops twice daily for the next 4-5 days. Then you can use them twice weekly. This can help prevent wax buildup.  Follow up with your primary care provider if symptoms are persistent.      ED Prescriptions     Medication Sig Dispense Auth. Provider   carbamide peroxide (DEBROX) 6.5 % OTIC solution  (Status: Discontinued) Place 5 drops into both ears 2 (two) times a week. 15 mL Alexcis Bicking, PA-C   carbamide peroxide (DEBROX) 6.5 % OTIC solution Place 5 drops into both ears 2 (two) times a week. 15 mL Glori Machnik, Wells Guiles, PA-C      PDMP not reviewed this encounter.   Maleeya Peterkin, Vernice Jefferson 03/31/22 1750

## 2022-03-31 NOTE — Discharge Instructions (Addendum)
I recommend to use the ear drops twice daily for the next 4-5 days. Then you can use them twice weekly. This can help prevent wax buildup.  Follow up with your primary care provider if symptoms are persistent.

## 2022-03-31 NOTE — ED Notes (Signed)
Attempted ear wax removal with ear cleaning water/peroxide. Pt expressed too much pain during and nurse stopped. Wells Guiles pa made aware

## 2022-04-15 DIAGNOSIS — H6122 Impacted cerumen, left ear: Secondary | ICD-10-CM | POA: Diagnosis not present

## 2022-04-15 DIAGNOSIS — H65192 Other acute nonsuppurative otitis media, left ear: Secondary | ICD-10-CM | POA: Diagnosis not present

## 2022-05-03 DIAGNOSIS — H6122 Impacted cerumen, left ear: Secondary | ICD-10-CM | POA: Diagnosis not present

## 2022-05-14 DIAGNOSIS — H04203 Unspecified epiphora, bilateral lacrimal glands: Secondary | ICD-10-CM | POA: Diagnosis not present

## 2022-05-14 DIAGNOSIS — H04553 Acquired stenosis of bilateral nasolacrimal duct: Secondary | ICD-10-CM | POA: Diagnosis not present

## 2022-06-05 DIAGNOSIS — Z23 Encounter for immunization: Secondary | ICD-10-CM | POA: Diagnosis not present

## 2022-06-12 DIAGNOSIS — Z1231 Encounter for screening mammogram for malignant neoplasm of breast: Secondary | ICD-10-CM | POA: Diagnosis not present

## 2022-06-14 DIAGNOSIS — Z23 Encounter for immunization: Secondary | ICD-10-CM | POA: Diagnosis not present

## 2022-06-14 DIAGNOSIS — Z79899 Other long term (current) drug therapy: Secondary | ICD-10-CM | POA: Diagnosis not present

## 2022-06-14 DIAGNOSIS — K9089 Other intestinal malabsorption: Secondary | ICD-10-CM | POA: Diagnosis not present

## 2022-06-14 DIAGNOSIS — J449 Chronic obstructive pulmonary disease, unspecified: Secondary | ICD-10-CM | POA: Diagnosis not present

## 2022-06-14 DIAGNOSIS — Z136 Encounter for screening for cardiovascular disorders: Secondary | ICD-10-CM | POA: Diagnosis not present

## 2022-06-14 DIAGNOSIS — K219 Gastro-esophageal reflux disease without esophagitis: Secondary | ICD-10-CM | POA: Diagnosis not present

## 2022-06-14 DIAGNOSIS — Z1331 Encounter for screening for depression: Secondary | ICD-10-CM | POA: Diagnosis not present

## 2022-06-14 DIAGNOSIS — Z0000 Encounter for general adult medical examination without abnormal findings: Secondary | ICD-10-CM | POA: Diagnosis not present

## 2022-10-08 DIAGNOSIS — H04222 Epiphora due to insufficient drainage, left lacrimal gland: Secondary | ICD-10-CM | POA: Diagnosis not present

## 2022-10-08 DIAGNOSIS — Z961 Presence of intraocular lens: Secondary | ICD-10-CM | POA: Diagnosis not present

## 2022-10-08 DIAGNOSIS — H26491 Other secondary cataract, right eye: Secondary | ICD-10-CM | POA: Diagnosis not present

## 2022-11-06 DIAGNOSIS — H26491 Other secondary cataract, right eye: Secondary | ICD-10-CM | POA: Diagnosis not present

## 2022-11-22 DIAGNOSIS — H04223 Epiphora due to insufficient drainage, bilateral lacrimal glands: Secondary | ICD-10-CM | POA: Diagnosis not present

## 2022-11-22 DIAGNOSIS — H04222 Epiphora due to insufficient drainage, left lacrimal gland: Secondary | ICD-10-CM | POA: Diagnosis not present

## 2022-11-22 DIAGNOSIS — H04553 Acquired stenosis of bilateral nasolacrimal duct: Secondary | ICD-10-CM | POA: Diagnosis not present

## 2022-12-05 DIAGNOSIS — Z23 Encounter for immunization: Secondary | ICD-10-CM | POA: Diagnosis not present

## 2022-12-05 DIAGNOSIS — J449 Chronic obstructive pulmonary disease, unspecified: Secondary | ICD-10-CM | POA: Diagnosis not present

## 2022-12-05 DIAGNOSIS — R946 Abnormal results of thyroid function studies: Secondary | ICD-10-CM | POA: Diagnosis not present

## 2022-12-05 DIAGNOSIS — E7800 Pure hypercholesterolemia, unspecified: Secondary | ICD-10-CM | POA: Diagnosis not present

## 2022-12-05 DIAGNOSIS — R001 Bradycardia, unspecified: Secondary | ICD-10-CM | POA: Diagnosis not present

## 2022-12-05 DIAGNOSIS — K9089 Other intestinal malabsorption: Secondary | ICD-10-CM | POA: Diagnosis not present

## 2022-12-05 DIAGNOSIS — K219 Gastro-esophageal reflux disease without esophagitis: Secondary | ICD-10-CM | POA: Diagnosis not present

## 2022-12-05 DIAGNOSIS — Z79899 Other long term (current) drug therapy: Secondary | ICD-10-CM | POA: Diagnosis not present

## 2022-12-05 DIAGNOSIS — M8580 Other specified disorders of bone density and structure, unspecified site: Secondary | ICD-10-CM | POA: Diagnosis not present

## 2023-01-01 DIAGNOSIS — R001 Bradycardia, unspecified: Secondary | ICD-10-CM | POA: Diagnosis not present

## 2023-02-27 DIAGNOSIS — L821 Other seborrheic keratosis: Secondary | ICD-10-CM | POA: Diagnosis not present

## 2023-02-27 DIAGNOSIS — L738 Other specified follicular disorders: Secondary | ICD-10-CM | POA: Diagnosis not present

## 2023-02-27 DIAGNOSIS — D1801 Hemangioma of skin and subcutaneous tissue: Secondary | ICD-10-CM | POA: Diagnosis not present

## 2023-02-27 DIAGNOSIS — L812 Freckles: Secondary | ICD-10-CM | POA: Diagnosis not present

## 2023-02-27 DIAGNOSIS — L570 Actinic keratosis: Secondary | ICD-10-CM | POA: Diagnosis not present

## 2023-03-11 IMAGING — DX DG CHEST 2V
2 series · 2 of 2 positions shown · non-contrast
Comparison: June 22, 2019

CLINICAL DATA: COPD

EXAM:
CHEST - 2 VIEW

[chest pa]
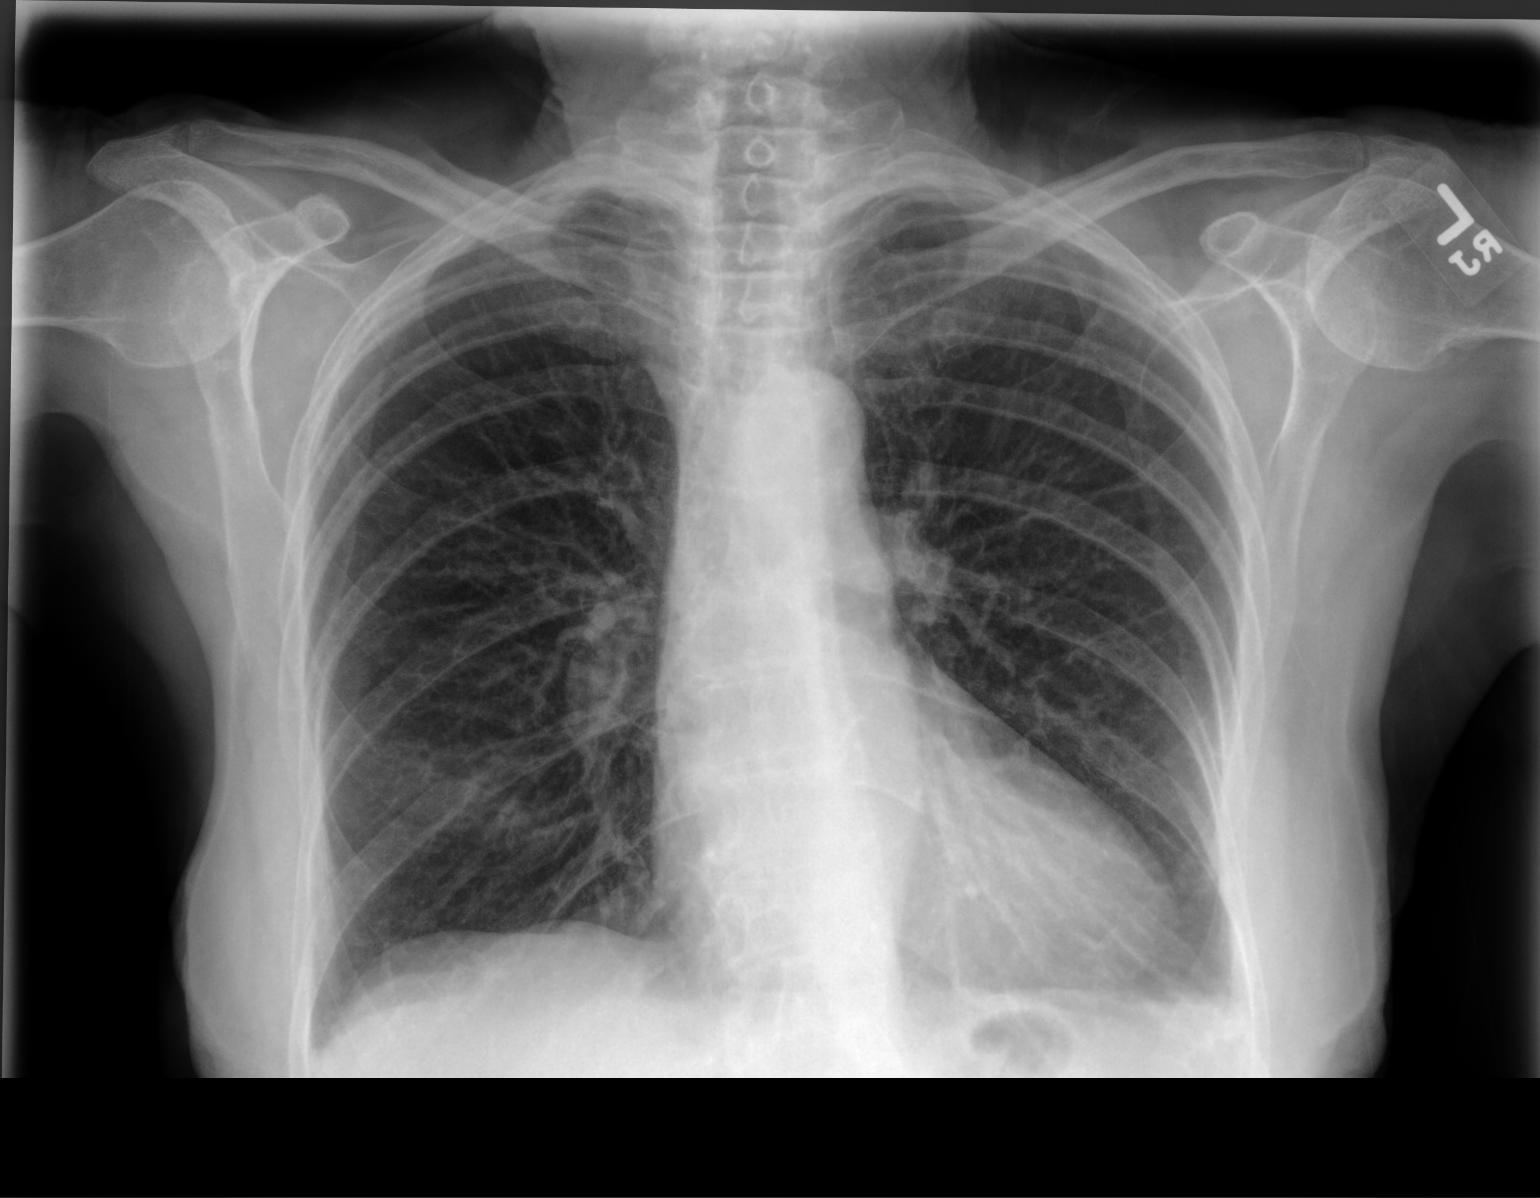

[chest lat]
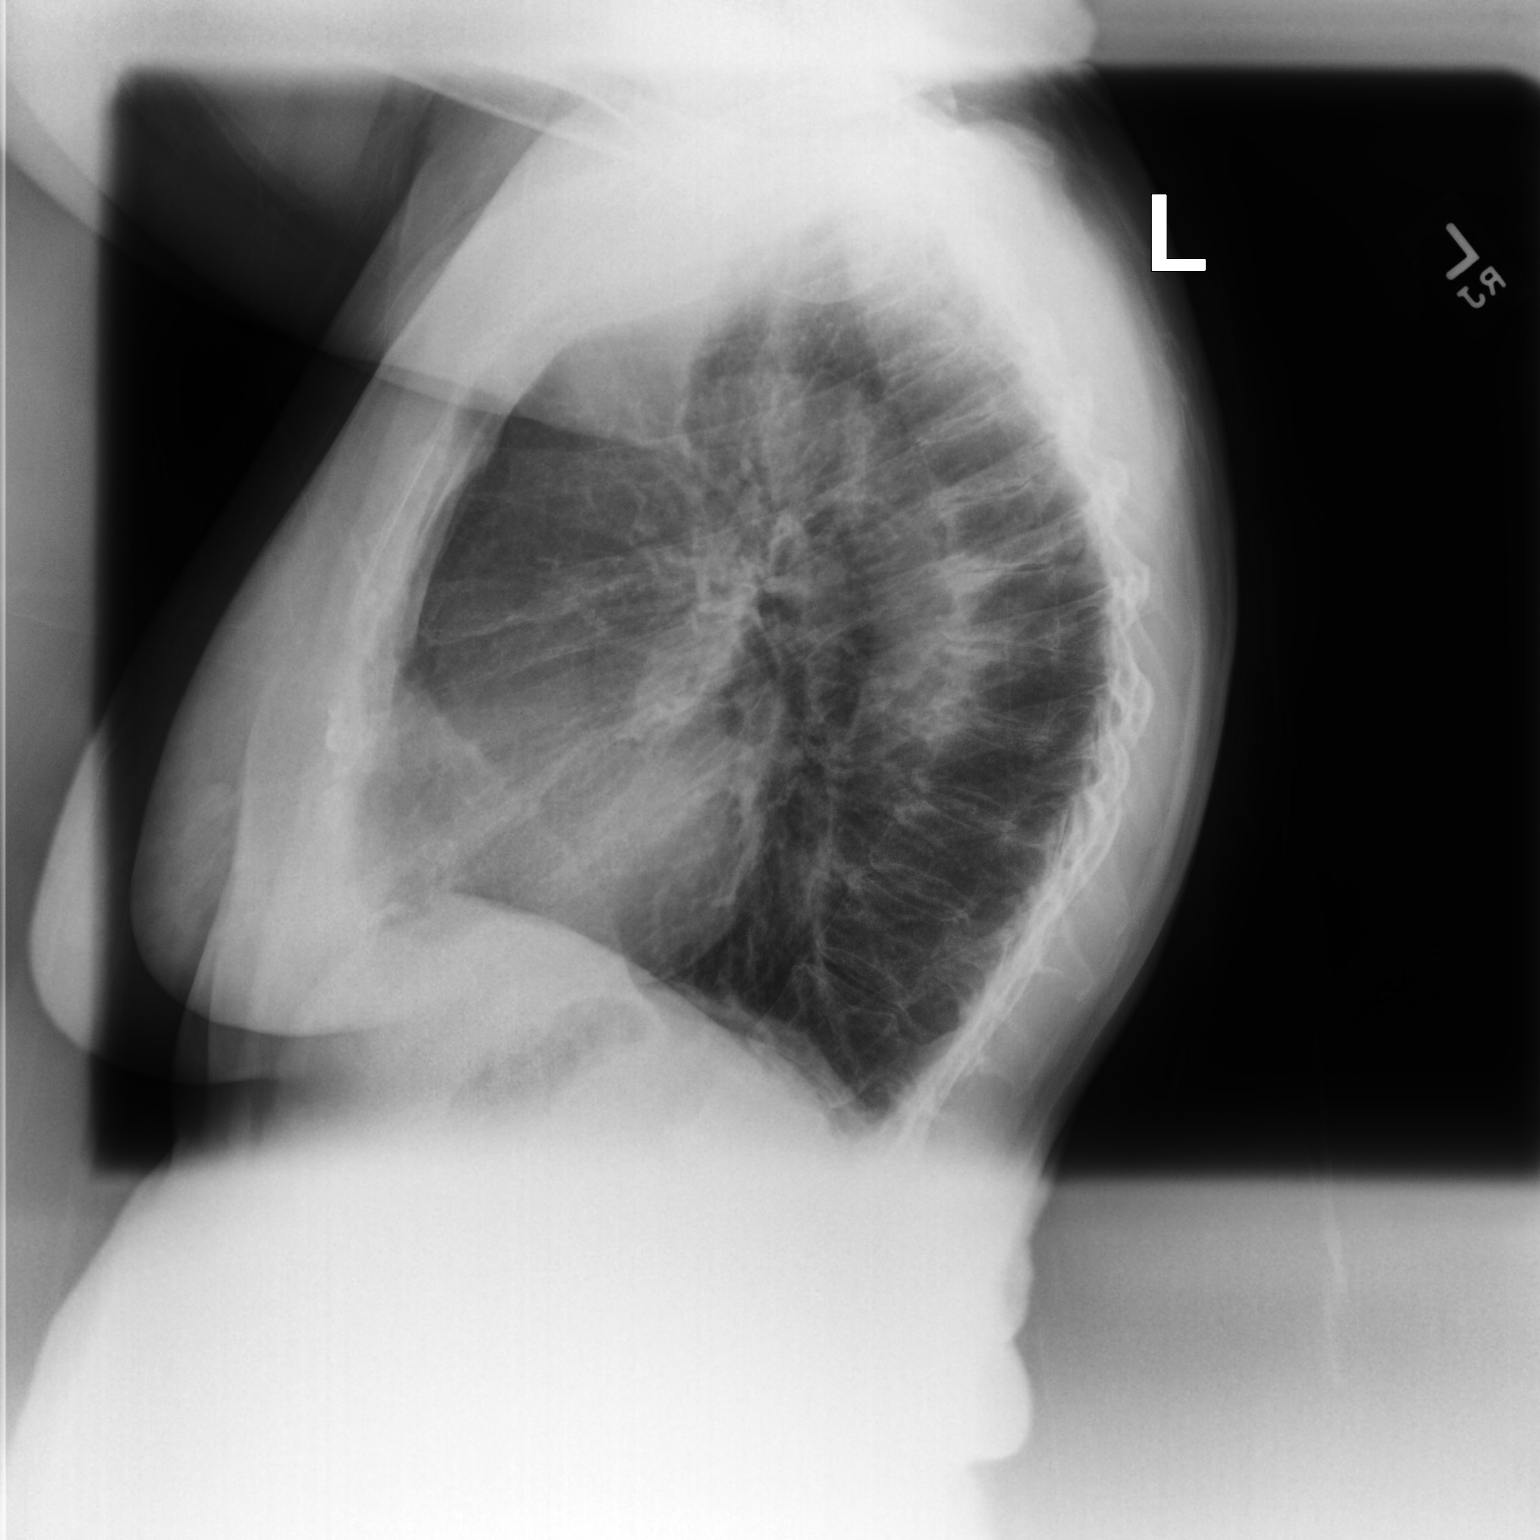

[2 of 2 positions shown; findings below may reference images not displayed]

FINDINGS: Cardiomediastinal silhouette is normal. Mediastinal contours appear
intact.

There is no evidence of focal airspace consolidation, pleural
effusion or pneumothorax. Bronchitic changes again noted.

Osseous structures are without acute abnormality. Exaggerated breast
masses. Soft tissues are grossly normal.
IMPRESSION: No active cardiopulmonary disease.

## 2023-03-20 ENCOUNTER — Other Ambulatory Visit: Payer: Self-pay | Admitting: Pulmonary Disease

## 2023-04-10 ENCOUNTER — Encounter: Payer: Self-pay | Admitting: Pulmonary Disease

## 2023-04-15 ENCOUNTER — Other Ambulatory Visit: Payer: Self-pay | Admitting: Pulmonary Disease

## 2023-04-15 MED ORDER — ACETAZOLAMIDE 125 MG PO TABS: 125.0000 mg | ORAL_TABLET | Freq: Two times a day (BID) | ORAL | 0 refills | Status: AC

## 2023-04-15 NOTE — Telephone Encounter (Signed)
Order for Diamox sent to pharmacy  125 twice daily for 5 days  Start 24 hours before ascent to altitude

## 2023-04-15 NOTE — Progress Notes (Signed)
Order for Diamox sent to [facility] 125 twice daily for 5 days

## 2023-05-06 ENCOUNTER — Ambulatory Visit: Payer: Medicare Other | Admitting: Pulmonary Disease

## 2023-05-06 ENCOUNTER — Encounter: Payer: Self-pay | Admitting: Pulmonary Disease

## 2023-05-06 VITALS — BP 122/74 | HR 57 | Temp 97.6°F | Ht 63.0 in | Wt 143.0 lb

## 2023-05-06 DIAGNOSIS — J449 Chronic obstructive pulmonary disease, unspecified: Secondary | ICD-10-CM | POA: Diagnosis not present

## 2023-05-06 NOTE — Progress Notes (Signed)
[name]    [id number]    [birth year]  Primary Care Physician:Henderson, Sherie Don, MD  Referring Physician: No referring provider defined for this encounter.  Chief complaint:  Patient being followed up for chronic obstructive pulmonary disease  HPI:  Has not had any recent exacerbation Continues to be compliant with Trelegy -Feels it continues to help  Not reaching for rescue inhaler use  Exercises regularly, walks about a mile a day and swims, water aerobics  Does not feel limited with activities of daily living  She does have a history of reflux, symptoms are well-controlled at present  No fevers, no chills, no recent exacerbation No recent ED visits   Outpatient Encounter Medications as of 05/06/2023  Medication Sig   acetaZOLAMIDE (DIAMOX) 125 MG tablet Take 1 tablet (125 mg total) by mouth 2 (two) times daily.   albuterol (VENTOLIN HFA) 108 (90 Base) MCG/ACT inhaler Inhale 2 puffs into the lungs every 6 (six) hours as needed for wheezing or shortness of breath.   Calcium-Magnesium-Vitamin D (CALCIUM MAGNESIUM PO) Take by mouth every other day.   carbamide peroxide (DEBROX) 6.5 % OTIC solution Place 5 drops into both ears 2 (two) times a week.   dextromethorphan-guaiFENesin (MUCINEX DM) 30-600 MG 12hr tablet Take 1 tablet by mouth 2 (two) times daily as needed for cough.   ergocalciferol (VITAMIN D2) 50000 units capsule Take 50,000 Units by mouth as directed.   fluticasone (FLONASE) 50 MCG/ACT nasal spray Place 2 sprays into both nostrils daily as needed for allergies or rhinitis.   Fluticasone-Umeclidin-Vilant (TRELEGY ELLIPTA) 100-62.5-25 MCG/ACT AEPB USE 1 INHALATION DAILY   Multiple Vitamin (MULTIVITAMIN) capsule Take 1 capsule by mouth daily.   omeprazole (PRILOSEC) 20 MG capsule Take 20 mg by mouth daily.   raloxifene (EVISTA) 60 MG tablet Take 60 mg by mouth daily.   risedronate (ACTONEL) 35 MG tablet Take 35 mg by mouth once a week.    amoxicillin (AMOXIL) 500 MG capsule Take 2 capsules now then take 1 capsule three times daily until gone. (Patient not taking: Reported on 05/06/2023)   COVID-19 mRNA bivalent vaccine, Pfizer, (PFIZER COVID-19 VAC BIVALENT) injection Inject into the muscle. (Patient not taking: Reported on 05/06/2023)   No facility-administered encounter medications on file as of 05/06/2023.    Allergies as of 05/06/2023   (No Known Allergies)    Past Medical History:  Diagnosis Date   Arthritis    COPD (chronic obstructive pulmonary disease) (HCC)    GERD (gastroesophageal reflux disease)    Shortness of breath dyspnea    occassional with the COPD    Past Surgical History:  Procedure Laterality Date   ABDOMINAL HYSTERECTOMY     COLONOSCOPY WITH PROPOFOL N/A 04/22/2016   Procedure: COLONOSCOPY WITH PROPOFOL;  Surgeon: Charolett Bumpers, MD;  Location: [facility];  Service: Endoscopy;  Laterality: N/A;   ESOPHAGOGASTRODUODENOSCOPY (EGD) WITH PROPOFOL N/A 04/22/2016   Procedure: ESOPHAGOGASTRODUODENOSCOPY (EGD) WITH PROPOFOL;  Surgeon: Charolett Bumpers, MD;  Location: [facility];  Service: Endoscopy;  Laterality: N/A;   TOTAL KNEE ARTHROPLASTY Bilateral     No family history on file.  Social History   Socioeconomic History   Marital status: Married    Spouse name: Not on file   Number of children: Not on file   Years of education: Not on file   Highest education level: Not on file  Occupational History   Occupation: Retired  Tobacco Use   Smoking status:  Former    Current packs/day: 0.00    Average packs/day: 1 pack/day for 27.0 years (27.0 ttl pk-yrs)    Types: Cigarettes    Start date: 09/16/1956    Quit date: 09/17/1983    Years since quitting: 39.6   Smokeless tobacco: Never  Substance and Sexual Activity   Alcohol use: Yes    Alcohol/week: 7.0 - 14.0 standard drinks of alcohol    Types: 7 - 14 Glasses of wine per week   Drug use: No   Sexual activity: Not on file  Other Topics  Concern   Not on file  Social History Narrative   Not on file   Social Determinants of Health   Financial Resource Strain: Not on file  Food Insecurity: Not on file  Transportation Needs: Not on file  Physical Activity: Not on file  Stress: Not on file  Social Connections: Not on file  Intimate Partner Violence: Not on file    Review of Systems  Constitutional: Negative.   HENT: Negative.    Eyes: Negative.   Respiratory: Negative.  Negative for apnea, cough and shortness of breath.   Cardiovascular: Negative.   Gastrointestinal: Negative.   Endocrine: Negative.   Psychiatric/Behavioral:  Negative for sleep disturbance.   All other systems reviewed and are negative.   Vitals:   05/06/23 1040  BP: 122/74  Pulse: (!) 57  Temp: 97.6 F (36.4 C)  SpO2: 98%     Physical Exam Constitutional:      Appearance: Normal appearance. She is well-developed.  HENT:     Head: Normocephalic and atraumatic.     Mouth/Throat:     Mouth: Mucous membranes are moist.  Eyes:     General: No scleral icterus. Neck:     Thyroid: No thyromegaly.     Trachea: No tracheal deviation.  Cardiovascular:     Rate and Rhythm: Normal rate and regular rhythm.  Pulmonary:     Effort: Pulmonary effort is normal. No respiratory distress.     Breath sounds: Normal breath sounds. No stridor. No wheezing, rhonchi or rales.  Abdominal:     Palpations: Abdomen is soft.  Musculoskeletal:        General: Normal range of motion.     Cervical back: No rigidity or tenderness.  Skin:    General: Skin is warm and dry.  Neurological:     Mental Status: She is alert and oriented to person, place, and time.  Psychiatric:        Mood and Affect: Mood normal.    Data Reviewed: Chart reviewed Previous chest x-ray reviewed PFT reviewed  PFT with no obstruction, no significant bronchodilator response, no restriction, normal diffusing capacity  Assessment:  COPD -Remains compliant with  Trelegy -Rarely needs albuterol  GERD -Symptoms controlled    Plan/Recommendations:  Rescue inhaler use as needed  Continue Trelegy  Follow-up in a year  Encouraged to stay active    Virl Diamond MD [facility] 05/06/2023, 10:44 AM  CC: No ref. provider found

## 2023-05-06 NOTE — Patient Instructions (Signed)
Continue Trelegy daily  Rescue inhaler use as needed  Stay as active as you are currently  I will see you a year from now  You are welcome to give [place] a call with any significant concerns

## 2023-05-07 ENCOUNTER — Other Ambulatory Visit (HOSPITAL_BASED_OUTPATIENT_CLINIC_OR_DEPARTMENT_OTHER): Payer: Self-pay

## 2023-05-26 ENCOUNTER — Other Ambulatory Visit (HOSPITAL_BASED_OUTPATIENT_CLINIC_OR_DEPARTMENT_OTHER): Payer: Self-pay

## 2023-05-26 DIAGNOSIS — Z23 Encounter for immunization: Secondary | ICD-10-CM | POA: Diagnosis not present

## 2023-05-26 MED ORDER — COMIRNATY 30 MCG/0.3ML IM SUSY
0.3000 mL | PREFILLED_SYRINGE | Freq: Once | INTRAMUSCULAR | 0 refills | Status: AC
  Filled 2023-05-26: qty 0.3, 1d supply, fill #0

## 2023-06-18 DIAGNOSIS — Z1231 Encounter for screening mammogram for malignant neoplasm of breast: Secondary | ICD-10-CM | POA: Diagnosis not present

## 2023-06-23 ENCOUNTER — Other Ambulatory Visit (HOSPITAL_BASED_OUTPATIENT_CLINIC_OR_DEPARTMENT_OTHER): Payer: Self-pay

## 2023-06-23 DIAGNOSIS — Z23 Encounter for immunization: Secondary | ICD-10-CM | POA: Diagnosis not present

## 2023-06-23 MED ORDER — INFLUENZA VAC A&B SURF ANT ADJ 0.5 ML IM SUSY
0.5000 mL | PREFILLED_SYRINGE | Freq: Once | INTRAMUSCULAR | 0 refills | Status: AC
  Filled 2023-06-23: qty 0.5, 1d supply, fill #0

## 2023-06-25 DIAGNOSIS — H903 Sensorineural hearing loss, bilateral: Secondary | ICD-10-CM | POA: Diagnosis not present

## 2023-06-25 DIAGNOSIS — Z822 Family history of deafness and hearing loss: Secondary | ICD-10-CM | POA: Diagnosis not present

## 2023-07-10 DIAGNOSIS — J449 Chronic obstructive pulmonary disease, unspecified: Secondary | ICD-10-CM | POA: Diagnosis not present

## 2023-07-10 DIAGNOSIS — K219 Gastro-esophageal reflux disease without esophagitis: Secondary | ICD-10-CM | POA: Diagnosis not present

## 2023-07-10 DIAGNOSIS — M8580 Other specified disorders of bone density and structure, unspecified site: Secondary | ICD-10-CM | POA: Diagnosis not present

## 2023-07-10 DIAGNOSIS — R109 Unspecified abdominal pain: Secondary | ICD-10-CM | POA: Diagnosis not present

## 2023-07-10 DIAGNOSIS — N816 Rectocele: Secondary | ICD-10-CM | POA: Diagnosis not present

## 2023-07-10 DIAGNOSIS — E7800 Pure hypercholesterolemia, unspecified: Secondary | ICD-10-CM | POA: Diagnosis not present

## 2023-07-10 DIAGNOSIS — K9089 Other intestinal malabsorption: Secondary | ICD-10-CM | POA: Diagnosis not present

## 2023-07-10 DIAGNOSIS — Z79899 Other long term (current) drug therapy: Secondary | ICD-10-CM | POA: Diagnosis not present

## 2023-07-10 DIAGNOSIS — Z23 Encounter for immunization: Secondary | ICD-10-CM | POA: Diagnosis not present

## 2023-07-10 DIAGNOSIS — N8110 Cystocele, unspecified: Secondary | ICD-10-CM | POA: Diagnosis not present

## 2023-07-10 DIAGNOSIS — R001 Bradycardia, unspecified: Secondary | ICD-10-CM | POA: Diagnosis not present

## 2023-07-10 DIAGNOSIS — R946 Abnormal results of thyroid function studies: Secondary | ICD-10-CM | POA: Diagnosis not present

## 2023-07-10 DIAGNOSIS — R7989 Other specified abnormal findings of blood chemistry: Secondary | ICD-10-CM | POA: Diagnosis not present

## 2023-07-10 DIAGNOSIS — Z0000 Encounter for general adult medical examination without abnormal findings: Secondary | ICD-10-CM | POA: Diagnosis not present

## 2023-07-11 ENCOUNTER — Other Ambulatory Visit: Payer: Self-pay | Admitting: Internal Medicine

## 2023-07-11 DIAGNOSIS — M8580 Other specified disorders of bone density and structure, unspecified site: Secondary | ICD-10-CM

## 2023-07-23 ENCOUNTER — Ambulatory Visit
Admission: RE | Admit: 2023-07-23 | Discharge: 2023-07-23 | Disposition: A | Discharge status: Home / Self Care | Payer: Medicare Other | Source: Ambulatory Visit | Attending: Internal Medicine | Admitting: Internal Medicine

## 2023-07-23 DIAGNOSIS — E2839 Other primary ovarian failure: Secondary | ICD-10-CM | POA: Diagnosis not present

## 2023-07-23 DIAGNOSIS — M8588 Other specified disorders of bone density and structure, other site: Secondary | ICD-10-CM | POA: Diagnosis not present

## 2023-07-23 DIAGNOSIS — M8580 Other specified disorders of bone density and structure, unspecified site: Secondary | ICD-10-CM

## 2023-07-23 DIAGNOSIS — Z90722 Acquired absence of ovaries, bilateral: Secondary | ICD-10-CM | POA: Diagnosis not present

## 2023-12-08 DIAGNOSIS — Z961 Presence of intraocular lens: Secondary | ICD-10-CM | POA: Diagnosis not present

## 2023-12-08 DIAGNOSIS — H524 Presbyopia: Secondary | ICD-10-CM | POA: Diagnosis not present

## 2023-12-29 ENCOUNTER — Other Ambulatory Visit (HOSPITAL_BASED_OUTPATIENT_CLINIC_OR_DEPARTMENT_OTHER): Payer: Self-pay

## 2023-12-29 DIAGNOSIS — Z23 Encounter for immunization: Secondary | ICD-10-CM | POA: Diagnosis not present

## 2023-12-29 MED ORDER — COVID-19 MRNA VAC-TRIS(PFIZER) 30 MCG/0.3ML IM SUSY
0.3000 mL | PREFILLED_SYRINGE | Freq: Once | INTRAMUSCULAR | 0 refills | Status: AC
  Filled 2023-12-29: qty 0.3, 1d supply, fill #0

## 2024-01-08 DIAGNOSIS — J449 Chronic obstructive pulmonary disease, unspecified: Secondary | ICD-10-CM | POA: Diagnosis not present

## 2024-01-08 DIAGNOSIS — K9089 Other intestinal malabsorption: Secondary | ICD-10-CM | POA: Diagnosis not present

## 2024-01-08 DIAGNOSIS — R7989 Other specified abnormal findings of blood chemistry: Secondary | ICD-10-CM | POA: Diagnosis not present

## 2024-01-08 DIAGNOSIS — R001 Bradycardia, unspecified: Secondary | ICD-10-CM | POA: Diagnosis not present

## 2024-01-08 DIAGNOSIS — M8580 Other specified disorders of bone density and structure, unspecified site: Secondary | ICD-10-CM | POA: Diagnosis not present

## 2024-01-08 DIAGNOSIS — E7800 Pure hypercholesterolemia, unspecified: Secondary | ICD-10-CM | POA: Diagnosis not present

## 2024-01-08 DIAGNOSIS — N816 Rectocele: Secondary | ICD-10-CM | POA: Diagnosis not present

## 2024-01-08 DIAGNOSIS — J029 Acute pharyngitis, unspecified: Secondary | ICD-10-CM | POA: Diagnosis not present

## 2024-01-08 DIAGNOSIS — N8110 Cystocele, unspecified: Secondary | ICD-10-CM | POA: Diagnosis not present

## 2024-01-08 DIAGNOSIS — K219 Gastro-esophageal reflux disease without esophagitis: Secondary | ICD-10-CM | POA: Diagnosis not present

## 2024-03-17 ENCOUNTER — Ambulatory Visit: Payer: Self-pay

## 2024-03-17 NOTE — Telephone Encounter (Signed)
 FYI Only or Action Required?: FYI only for provider.  Patient is followed in Pulmonology for COPD, last seen on 05/06/2023 by Neda Jennet LABOR, MD. Called Nurse Triage reporting No chief complaint on file.. Symptoms began several days ago. Interventions attempted: Nothing. Symptoms are: gradually worsening.  Triage Disposition: See Physician Within 24 Hours  Patient/caregiver understands and will follow disposition?: Yes  Copied from CRM [id number]. Topic: Clinical - Red Word Triage >> Mar 17, 2024  2:46 PM Chantha C wrote: Red Word that prompted transfer to Nurse Triage: Patient [phone] states has COPD and has a chest cold, chest congestion, coughing phlegm greenish, runny nose, crutching sounds in chest no wheezing, shortness of breath, and chills. Patient unsure of having a fever, denies pain. Patient is asking if Dr. Neda would recommend patient take a medication. Please advise.   [id number] [place], [place] - [address] AT [facility] OF [address] & [facility] [address] [place] [address] Phone: [phone] Fax: [phone]  Reason for Disposition  [1] Known COPD or other severe lung disease (i.e., bronchiectasis, cystic fibrosis, lung surgery) AND [2] worsening symptoms (i.e., increased sputum purulence or amount, increased breathing difficulty  Answer Assessment - Initial Assessment Questions 1. ONSET: When did the cough begin?      Couple of days ago  2. SEVERITY: How bad is the cough today?      Intermittent  3. SPUTUM: Describe the color of your sputum (none, dry cough; clear, white, yellow, green)     Green  4. HEMOPTYSIS: Are you coughing up any blood? If so ask: How much? (flecks, streaks, tablespoons, etc.)     None  5. DIFFICULTY BREATHING: Are you having difficulty breathing? If Yes, ask: How bad is it? (e.g., mild, moderate, severe)    - MILD: No SOB at rest, mild SOB with walking, speaks normally in sentences,  can lie down, no retractions, pulse < 100.    - MODERATE: SOB at rest, SOB with minimal exertion and prefers to sit, cannot lie down flat, speaks in phrases, mild retractions, audible wheezing, pulse 100-120.    - SEVERE: Very SOB at rest, speaks in single words, struggling to breathe, sitting hunched forward, retractions, pulse > 120       Mild  6. FEVER: Do you have a fever? If Yes, ask: What is your temperature, how was it measured, and when did it start?     Slight  7. CARDIAC HISTORY: Do you have any history of heart disease? (e.g., heart attack, congestive heart failure)      No  8. LUNG HISTORY: Do you have any history of lung disease?  (e.g., pulmonary embolus, asthma, emphysema)     COPD  9. PE RISK FACTORS: Do you have a history of blood clots? (or: recent major surgery, recent prolonged travel, bedridden)     No  10. OTHER SYMPTOMS: Do you have any other symptoms? (e.g., runny nose, wheezing, chest pain)       Runny Nose  11. PREGNANCY: Is there any chance you are pregnant? When was your last menstrual period?       No, No  12. TRAVEL: Have you traveled out of the country in the last month? (e.g., travel history, exposures)       A lot of children at the beach  Protocols used: Cough - Acute Productive-A-AH

## 2024-03-18 NOTE — Telephone Encounter (Deleted)
 AO not available, so routing to DOD  Please see RN triage notes below and advised any recs, thanks!

## 2024-03-23 DIAGNOSIS — L821 Other seborrheic keratosis: Secondary | ICD-10-CM | POA: Diagnosis not present

## 2024-03-23 DIAGNOSIS — L570 Actinic keratosis: Secondary | ICD-10-CM | POA: Diagnosis not present

## 2024-03-23 DIAGNOSIS — D225 Melanocytic nevi of trunk: Secondary | ICD-10-CM | POA: Diagnosis not present

## 2024-05-26 ENCOUNTER — Other Ambulatory Visit: Payer: Self-pay | Admitting: Pulmonary Disease

## 2024-06-15 DIAGNOSIS — Z23 Encounter for immunization: Secondary | ICD-10-CM | POA: Diagnosis not present

## 2024-07-16 DIAGNOSIS — Z0000 Encounter for general adult medical examination without abnormal findings: Secondary | ICD-10-CM | POA: Diagnosis not present

## 2024-07-16 DIAGNOSIS — E7800 Pure hypercholesterolemia, unspecified: Secondary | ICD-10-CM | POA: Diagnosis not present

## 2024-07-16 DIAGNOSIS — N816 Rectocele: Secondary | ICD-10-CM | POA: Diagnosis not present

## 2024-07-16 DIAGNOSIS — N8110 Cystocele, unspecified: Secondary | ICD-10-CM | POA: Diagnosis not present

## 2024-07-16 DIAGNOSIS — J449 Chronic obstructive pulmonary disease, unspecified: Secondary | ICD-10-CM | POA: Diagnosis not present

## 2024-07-16 DIAGNOSIS — M85852 Other specified disorders of bone density and structure, left thigh: Secondary | ICD-10-CM | POA: Diagnosis not present

## 2024-07-16 DIAGNOSIS — Z79899 Other long term (current) drug therapy: Secondary | ICD-10-CM | POA: Diagnosis not present

## 2024-07-16 DIAGNOSIS — K9089 Other intestinal malabsorption: Secondary | ICD-10-CM | POA: Diagnosis not present

## 2024-07-16 DIAGNOSIS — Z1331 Encounter for screening for depression: Secondary | ICD-10-CM | POA: Diagnosis not present

## 2024-07-16 DIAGNOSIS — K219 Gastro-esophageal reflux disease without esophagitis: Secondary | ICD-10-CM | POA: Diagnosis not present

## 2024-07-16 DIAGNOSIS — M8580 Other specified disorders of bone density and structure, unspecified site: Secondary | ICD-10-CM | POA: Diagnosis not present

## 2024-07-16 DIAGNOSIS — R946 Abnormal results of thyroid function studies: Secondary | ICD-10-CM | POA: Diagnosis not present

## 2024-07-16 DIAGNOSIS — R001 Bradycardia, unspecified: Secondary | ICD-10-CM | POA: Diagnosis not present
